# Patient Record
Sex: Female | Born: 1960 | Race: Black or African American | Hispanic: No | Smoking: Never smoker
Health system: Southern US, Community
[De-identification: ages and names within clinical notes are randomized; demographics above are authoritative.]

---

## 1995-04-05 ENCOUNTER — Encounter (INDEPENDENT_AMBULATORY_CARE_PROVIDER_SITE_OTHER): Payer: Self-pay | Admitting: *Deleted

## 1995-04-05 ENCOUNTER — Encounter: Payer: Self-pay | Admitting: Internal Medicine

## 1997-12-23 ENCOUNTER — Encounter: Admission: RE | Admit: 1997-12-23 | Discharge: 1997-12-23 | Payer: Self-pay | Admitting: Internal Medicine

## 1998-01-13 ENCOUNTER — Encounter: Admission: RE | Admit: 1998-01-13 | Discharge: 1998-01-13 | Payer: Self-pay | Admitting: Internal Medicine

## 1998-03-31 ENCOUNTER — Encounter: Admission: RE | Admit: 1998-03-31 | Discharge: 1998-03-31 | Payer: Self-pay | Admitting: Internal Medicine

## 1998-08-04 ENCOUNTER — Encounter: Admission: RE | Admit: 1998-08-04 | Discharge: 1998-08-04 | Payer: Self-pay | Admitting: Internal Medicine

## 1998-09-28 ENCOUNTER — Encounter: Admission: RE | Admit: 1998-09-28 | Discharge: 1998-09-28 | Payer: Self-pay | Admitting: Internal Medicine

## 1999-01-04 ENCOUNTER — Encounter: Admission: RE | Admit: 1999-01-04 | Discharge: 1999-01-04 | Payer: Self-pay | Admitting: Internal Medicine

## 1999-01-04 ENCOUNTER — Ambulatory Visit (HOSPITAL_COMMUNITY): Admission: RE | Admit: 1999-01-04 | Discharge: 1999-01-04 | Payer: Self-pay | Admitting: Internal Medicine

## 1999-04-05 ENCOUNTER — Ambulatory Visit (HOSPITAL_COMMUNITY): Admission: RE | Admit: 1999-04-05 | Discharge: 1999-04-05 | Payer: Self-pay | Admitting: Internal Medicine

## 1999-04-05 ENCOUNTER — Encounter: Admission: RE | Admit: 1999-04-05 | Discharge: 1999-04-05 | Payer: Self-pay | Admitting: Internal Medicine

## 1999-07-06 ENCOUNTER — Ambulatory Visit (HOSPITAL_COMMUNITY): Admission: RE | Admit: 1999-07-06 | Discharge: 1999-07-06 | Payer: Self-pay | Admitting: Internal Medicine

## 1999-07-06 ENCOUNTER — Encounter: Admission: RE | Admit: 1999-07-06 | Discharge: 1999-07-06 | Payer: Self-pay | Admitting: Internal Medicine

## 1999-09-20 ENCOUNTER — Encounter: Admission: RE | Admit: 1999-09-20 | Discharge: 1999-09-20 | Payer: Self-pay | Admitting: Internal Medicine

## 2000-01-17 ENCOUNTER — Encounter: Admission: RE | Admit: 2000-01-17 | Discharge: 2000-01-17 | Payer: Self-pay | Admitting: Internal Medicine

## 2000-01-17 ENCOUNTER — Ambulatory Visit (HOSPITAL_COMMUNITY): Admission: RE | Admit: 2000-01-17 | Discharge: 2000-01-17 | Payer: Self-pay | Admitting: Internal Medicine

## 2000-03-20 ENCOUNTER — Ambulatory Visit (HOSPITAL_COMMUNITY): Admission: RE | Admit: 2000-03-20 | Discharge: 2000-03-20 | Payer: Self-pay | Admitting: Internal Medicine

## 2000-03-20 ENCOUNTER — Encounter: Admission: RE | Admit: 2000-03-20 | Discharge: 2000-03-20 | Payer: Self-pay | Admitting: Internal Medicine

## 2000-03-20 ENCOUNTER — Other Ambulatory Visit: Admission: RE | Admit: 2000-03-20 | Discharge: 2000-03-20 | Payer: Self-pay | Admitting: Internal Medicine

## 2000-05-16 ENCOUNTER — Encounter: Admission: RE | Admit: 2000-05-16 | Discharge: 2000-05-16 | Payer: Self-pay | Admitting: Obstetrics & Gynecology

## 2000-05-16 ENCOUNTER — Other Ambulatory Visit: Admission: RE | Admit: 2000-05-16 | Discharge: 2000-05-16 | Payer: Self-pay | Admitting: Obstetrics

## 2000-07-03 ENCOUNTER — Ambulatory Visit (HOSPITAL_COMMUNITY): Admission: RE | Admit: 2000-07-03 | Discharge: 2000-07-03 | Payer: Self-pay | Admitting: Internal Medicine

## 2000-07-03 ENCOUNTER — Encounter: Admission: RE | Admit: 2000-07-03 | Discharge: 2000-07-03 | Payer: Self-pay | Admitting: Internal Medicine

## 2000-07-18 ENCOUNTER — Encounter: Admission: RE | Admit: 2000-07-18 | Discharge: 2000-07-18 | Payer: Self-pay | Admitting: Internal Medicine

## 2000-12-26 ENCOUNTER — Encounter: Admission: RE | Admit: 2000-12-26 | Discharge: 2000-12-26 | Payer: Self-pay | Admitting: Internal Medicine

## 2001-07-03 ENCOUNTER — Encounter: Admission: RE | Admit: 2001-07-03 | Discharge: 2001-07-03 | Payer: Self-pay | Admitting: Internal Medicine

## 2001-07-03 ENCOUNTER — Ambulatory Visit (HOSPITAL_COMMUNITY): Admission: RE | Admit: 2001-07-03 | Discharge: 2001-07-03 | Payer: Self-pay | Admitting: Internal Medicine

## 2001-08-28 ENCOUNTER — Encounter: Admission: RE | Admit: 2001-08-28 | Discharge: 2001-08-28 | Payer: Self-pay | Admitting: Internal Medicine

## 2001-10-30 ENCOUNTER — Ambulatory Visit (HOSPITAL_COMMUNITY): Admission: RE | Admit: 2001-10-30 | Discharge: 2001-10-30 | Payer: Self-pay | Admitting: Internal Medicine

## 2001-10-30 ENCOUNTER — Encounter: Admission: RE | Admit: 2001-10-30 | Discharge: 2001-10-30 | Payer: Self-pay | Admitting: Internal Medicine

## 2002-03-26 ENCOUNTER — Encounter: Admission: RE | Admit: 2002-03-26 | Discharge: 2002-03-26 | Payer: Self-pay | Admitting: Internal Medicine

## 2003-02-19 ENCOUNTER — Encounter: Admission: RE | Admit: 2003-02-19 | Discharge: 2003-02-19 | Payer: Self-pay | Admitting: Internal Medicine

## 2003-02-19 ENCOUNTER — Ambulatory Visit (HOSPITAL_COMMUNITY): Admission: RE | Admit: 2003-02-19 | Discharge: 2003-02-19 | Payer: Self-pay | Admitting: Internal Medicine

## 2003-02-19 ENCOUNTER — Encounter: Payer: Self-pay | Admitting: Internal Medicine

## 2003-07-08 ENCOUNTER — Encounter: Admission: RE | Admit: 2003-07-08 | Discharge: 2003-07-08 | Payer: Self-pay | Admitting: Internal Medicine

## 2003-07-22 ENCOUNTER — Ambulatory Visit (HOSPITAL_COMMUNITY): Admission: RE | Admit: 2003-07-22 | Discharge: 2003-07-22 | Payer: Self-pay | Admitting: Internal Medicine

## 2003-07-22 ENCOUNTER — Encounter (INDEPENDENT_AMBULATORY_CARE_PROVIDER_SITE_OTHER): Payer: Self-pay | Admitting: Infectious Diseases

## 2003-07-22 ENCOUNTER — Encounter: Admission: RE | Admit: 2003-07-22 | Discharge: 2003-07-22 | Payer: Self-pay | Admitting: Infectious Diseases

## 2003-09-08 ENCOUNTER — Encounter: Admission: RE | Admit: 2003-09-08 | Discharge: 2003-09-08 | Payer: Self-pay | Admitting: Internal Medicine

## 2004-02-04 ENCOUNTER — Encounter: Admission: RE | Admit: 2004-02-04 | Discharge: 2004-02-04 | Payer: Self-pay | Admitting: Infectious Diseases

## 2004-06-03 ENCOUNTER — Ambulatory Visit: Payer: Self-pay | Admitting: Internal Medicine

## 2004-06-03 ENCOUNTER — Ambulatory Visit (HOSPITAL_COMMUNITY): Admission: RE | Admit: 2004-06-03 | Discharge: 2004-06-03 | Payer: Self-pay | Admitting: Internal Medicine

## 2004-09-06 ENCOUNTER — Ambulatory Visit (HOSPITAL_COMMUNITY): Admission: RE | Admit: 2004-09-06 | Discharge: 2004-09-06 | Payer: Self-pay | Admitting: Internal Medicine

## 2004-09-06 ENCOUNTER — Encounter (INDEPENDENT_AMBULATORY_CARE_PROVIDER_SITE_OTHER): Payer: Self-pay | Admitting: *Deleted

## 2004-09-06 ENCOUNTER — Ambulatory Visit: Payer: Self-pay | Admitting: Internal Medicine

## 2004-09-06 LAB — CONVERTED CEMR LAB: CD4 Count: 430 microliters

## 2004-10-19 ENCOUNTER — Ambulatory Visit: Payer: Self-pay | Admitting: Internal Medicine

## 2004-10-19 ENCOUNTER — Encounter (INDEPENDENT_AMBULATORY_CARE_PROVIDER_SITE_OTHER): Payer: Self-pay | Admitting: *Deleted

## 2004-11-25 ENCOUNTER — Other Ambulatory Visit: Admission: RE | Admit: 2004-11-25 | Discharge: 2004-11-25 | Payer: Self-pay | Admitting: Family Medicine

## 2004-11-25 ENCOUNTER — Encounter: Payer: Self-pay | Admitting: Internal Medicine

## 2004-11-25 ENCOUNTER — Ambulatory Visit: Payer: Self-pay | Admitting: Family Medicine

## 2004-11-25 ENCOUNTER — Encounter (INDEPENDENT_AMBULATORY_CARE_PROVIDER_SITE_OTHER): Payer: Self-pay | Admitting: Specialist

## 2004-12-09 ENCOUNTER — Ambulatory Visit: Payer: Self-pay | Admitting: Family Medicine

## 2005-03-22 ENCOUNTER — Ambulatory Visit: Payer: Self-pay | Admitting: Internal Medicine

## 2005-03-22 ENCOUNTER — Ambulatory Visit (HOSPITAL_COMMUNITY): Admission: RE | Admit: 2005-03-22 | Discharge: 2005-03-22 | Payer: Self-pay | Admitting: Internal Medicine

## 2005-03-22 ENCOUNTER — Encounter (INDEPENDENT_AMBULATORY_CARE_PROVIDER_SITE_OTHER): Payer: Self-pay | Admitting: *Deleted

## 2005-06-14 ENCOUNTER — Ambulatory Visit: Payer: Self-pay | Admitting: Internal Medicine

## 2006-02-28 ENCOUNTER — Ambulatory Visit: Payer: Self-pay | Admitting: Internal Medicine

## 2006-02-28 ENCOUNTER — Encounter (INDEPENDENT_AMBULATORY_CARE_PROVIDER_SITE_OTHER): Payer: Self-pay | Admitting: *Deleted

## 2006-02-28 ENCOUNTER — Encounter: Admission: RE | Admit: 2006-02-28 | Discharge: 2006-02-28 | Payer: Self-pay | Admitting: Internal Medicine

## 2006-03-14 ENCOUNTER — Ambulatory Visit: Payer: Self-pay | Admitting: Internal Medicine

## 2006-10-09 ENCOUNTER — Encounter (INDEPENDENT_AMBULATORY_CARE_PROVIDER_SITE_OTHER): Payer: Self-pay | Admitting: *Deleted

## 2006-10-09 LAB — CONVERTED CEMR LAB: Pap Smear: ABNORMAL

## 2006-10-13 DIAGNOSIS — G5600 Carpal tunnel syndrome, unspecified upper limb: Secondary | ICD-10-CM | POA: Insufficient documentation

## 2006-10-13 DIAGNOSIS — K219 Gastro-esophageal reflux disease without esophagitis: Secondary | ICD-10-CM | POA: Insufficient documentation

## 2006-10-13 DIAGNOSIS — F79 Unspecified intellectual disabilities: Secondary | ICD-10-CM

## 2006-10-13 DIAGNOSIS — F1021 Alcohol dependence, in remission: Secondary | ICD-10-CM

## 2006-10-13 DIAGNOSIS — B20 Human immunodeficiency virus [HIV] disease: Secondary | ICD-10-CM

## 2006-10-13 DIAGNOSIS — K029 Dental caries, unspecified: Secondary | ICD-10-CM | POA: Insufficient documentation

## 2006-10-13 DIAGNOSIS — L0292 Furuncle, unspecified: Secondary | ICD-10-CM | POA: Insufficient documentation

## 2006-10-13 DIAGNOSIS — Z87891 Personal history of nicotine dependence: Secondary | ICD-10-CM

## 2006-10-13 DIAGNOSIS — L0293 Carbuncle, unspecified: Secondary | ICD-10-CM

## 2006-10-13 DIAGNOSIS — E669 Obesity, unspecified: Secondary | ICD-10-CM

## 2006-10-19 ENCOUNTER — Encounter: Admission: RE | Admit: 2006-10-19 | Discharge: 2006-10-19 | Payer: Self-pay | Admitting: Internal Medicine

## 2006-10-19 ENCOUNTER — Ambulatory Visit: Payer: Self-pay | Admitting: Internal Medicine

## 2006-10-19 LAB — CONVERTED CEMR LAB
ALT: 13 units/L (ref 0–35)
AST: 18 units/L (ref 0–37)
Alkaline Phosphatase: 83 units/L (ref 39–117)
Creatinine, Ser: 0.67 mg/dL (ref 0.40–1.20)
HCT: 37.6 % (ref 36.0–46.0)
HIV 1 RNA Quant: 42900 copies/mL — ABNORMAL HIGH (ref <50)
LDL Cholesterol: 41 mg/dL (ref 0–99)
MCV: 77.4 fL — ABNORMAL LOW (ref 78.0–100.0)
Platelets: 295 10*3/uL (ref 150–400)
RDW: 16.5 % — ABNORMAL HIGH (ref 11.5–14.0)
Sodium: 138 meq/L (ref 135–145)
Total Bilirubin: 0.3 mg/dL (ref 0.3–1.2)
Total CHOL/HDL Ratio: 3.5
Total Protein: 9 g/dL — ABNORMAL HIGH (ref 6.0–8.3)
VLDL: 55 mg/dL — ABNORMAL HIGH (ref 0–40)

## 2006-10-22 ENCOUNTER — Encounter (INDEPENDENT_AMBULATORY_CARE_PROVIDER_SITE_OTHER): Payer: Self-pay | Admitting: *Deleted

## 2006-11-10 ENCOUNTER — Other Ambulatory Visit: Admission: RE | Admit: 2006-11-10 | Discharge: 2006-11-10 | Payer: Self-pay | Admitting: Internal Medicine

## 2006-11-10 ENCOUNTER — Encounter (INDEPENDENT_AMBULATORY_CARE_PROVIDER_SITE_OTHER): Payer: Self-pay | Admitting: *Deleted

## 2006-11-10 ENCOUNTER — Ambulatory Visit: Payer: Self-pay | Admitting: Internal Medicine

## 2006-11-10 LAB — CONVERTED CEMR LAB

## 2006-11-13 ENCOUNTER — Encounter: Payer: Self-pay | Admitting: Internal Medicine

## 2006-11-13 LAB — CONVERTED CEMR LAB: Pap Smear: ABNORMAL

## 2006-11-21 ENCOUNTER — Encounter: Payer: Self-pay | Admitting: Internal Medicine

## 2007-04-18 ENCOUNTER — Ambulatory Visit: Payer: Self-pay | Admitting: Internal Medicine

## 2007-04-18 ENCOUNTER — Encounter: Admission: RE | Admit: 2007-04-18 | Discharge: 2007-04-18 | Payer: Self-pay | Admitting: Internal Medicine

## 2007-04-18 LAB — CONVERTED CEMR LAB
AST: 17 units/L (ref 0–37)
BUN: 11 mg/dL (ref 6–23)
Calcium: 8.4 mg/dL (ref 8.4–10.5)
Chloride: 105 meq/L (ref 96–112)
Creatinine, Ser: 0.67 mg/dL (ref 0.40–1.20)
HCT: 34 % — ABNORMAL LOW (ref 36.0–46.0)
HIV 1 RNA Quant: 28200 copies/mL — ABNORMAL HIGH (ref <50)
Hemoglobin: 10.4 g/dL — ABNORMAL LOW (ref 12.0–15.0)
MCHC: 30.6 g/dL (ref 30.0–36.0)
MCV: 73 fL — ABNORMAL LOW (ref 78.0–100.0)
RDW: 17 % — ABNORMAL HIGH (ref 11.5–14.0)
Total Bilirubin: 0.4 mg/dL (ref 0.3–1.2)

## 2007-07-18 ENCOUNTER — Encounter: Payer: Self-pay | Admitting: Internal Medicine

## 2007-07-21 ENCOUNTER — Encounter: Payer: Self-pay | Admitting: Internal Medicine

## 2007-08-03 ENCOUNTER — Encounter: Payer: Self-pay | Admitting: Internal Medicine

## 2007-08-06 ENCOUNTER — Encounter: Payer: Self-pay | Admitting: Internal Medicine

## 2007-08-14 ENCOUNTER — Encounter: Payer: Self-pay | Admitting: Internal Medicine

## 2007-08-21 ENCOUNTER — Encounter: Admission: RE | Admit: 2007-08-21 | Discharge: 2007-08-21 | Payer: Self-pay | Admitting: Internal Medicine

## 2007-08-21 ENCOUNTER — Ambulatory Visit: Payer: Self-pay | Admitting: Internal Medicine

## 2007-08-21 LAB — CONVERTED CEMR LAB
Albumin: 3.6 g/dL (ref 3.5–5.2)
BUN: 20 mg/dL (ref 6–23)
Calcium: 8.5 mg/dL (ref 8.4–10.5)
Chloride: 108 meq/L (ref 96–112)
Glucose, Bld: 95 mg/dL (ref 70–99)
HDL: 44 mg/dL (ref >39)
Lymphs Abs: 2.9 10*3/uL (ref 0.7–4.0)
MCV: 74 fL — ABNORMAL LOW (ref 78.0–100.0)
Monocytes Relative: 8 % (ref 3–12)
Neutro Abs: 2.5 10*3/uL (ref 1.7–7.7)
Neutrophils Relative %: 42 % — ABNORMAL LOW (ref 43–77)
Potassium: 4.1 meq/L (ref 3.5–5.3)
RBC: 4.5 M/uL (ref 3.87–5.11)
Triglycerides: 146 mg/dL (ref <150)
WBC: 6 10*3/uL (ref 4.0–10.5)

## 2007-08-27 ENCOUNTER — Telehealth: Payer: Self-pay | Admitting: Internal Medicine

## 2007-08-30 ENCOUNTER — Encounter: Payer: Self-pay | Admitting: Internal Medicine

## 2007-10-11 ENCOUNTER — Encounter (INDEPENDENT_AMBULATORY_CARE_PROVIDER_SITE_OTHER): Payer: Self-pay | Admitting: *Deleted

## 2007-10-18 ENCOUNTER — Ambulatory Visit: Payer: Self-pay | Admitting: Internal Medicine

## 2007-10-18 DIAGNOSIS — I10 Essential (primary) hypertension: Secondary | ICD-10-CM | POA: Insufficient documentation

## 2007-10-31 ENCOUNTER — Telehealth: Payer: Self-pay | Admitting: Internal Medicine

## 2007-11-13 ENCOUNTER — Ambulatory Visit: Payer: Self-pay | Admitting: Internal Medicine

## 2007-11-21 ENCOUNTER — Ambulatory Visit: Payer: Self-pay | Admitting: Obstetrics and Gynecology

## 2007-11-21 ENCOUNTER — Encounter: Payer: Self-pay | Admitting: Family

## 2007-11-21 ENCOUNTER — Encounter: Payer: Self-pay | Admitting: Internal Medicine

## 2007-11-28 ENCOUNTER — Ambulatory Visit (HOSPITAL_COMMUNITY): Admission: RE | Admit: 2007-11-28 | Discharge: 2007-11-28 | Payer: Self-pay | Admitting: Obstetrics and Gynecology

## 2007-12-11 ENCOUNTER — Encounter: Payer: Self-pay | Admitting: Internal Medicine

## 2007-12-27 ENCOUNTER — Encounter: Admission: RE | Admit: 2007-12-27 | Discharge: 2007-12-27 | Payer: Self-pay | Admitting: Internal Medicine

## 2007-12-27 ENCOUNTER — Ambulatory Visit: Payer: Self-pay | Admitting: Internal Medicine

## 2007-12-27 LAB — CONVERTED CEMR LAB
CO2: 21 meq/L (ref 19–32)
Calcium: 8.9 mg/dL (ref 8.4–10.5)
Cholesterol: 139 mg/dL (ref 0–200)
Creatinine, Ser: 0.7 mg/dL (ref 0.40–1.20)
Glucose, Bld: 69 mg/dL — ABNORMAL LOW (ref 70–99)
HCT: 34.9 % — ABNORMAL LOW (ref 36.0–46.0)
HIV-1 RNA Quant, Log: 3.88 — ABNORMAL HIGH (ref <1.70)
MCV: 74.4 fL — ABNORMAL LOW (ref 78.0–100.0)
RBC: 4.69 M/uL (ref 3.87–5.11)
Sodium: 135 meq/L (ref 135–145)
Total Bilirubin: 0.2 mg/dL — ABNORMAL LOW (ref 0.3–1.2)
Total Protein: 8.2 g/dL (ref 6.0–8.3)
Triglycerides: 85 mg/dL (ref <150)
VLDL: 17 mg/dL (ref 0–40)
WBC: 6.7 10*3/uL (ref 4.0–10.5)

## 2007-12-28 ENCOUNTER — Encounter: Payer: Self-pay | Admitting: Internal Medicine

## 2008-01-15 ENCOUNTER — Ambulatory Visit: Payer: Self-pay | Admitting: Internal Medicine

## 2008-01-15 LAB — CONVERTED CEMR LAB

## 2008-02-28 ENCOUNTER — Ambulatory Visit: Payer: Self-pay | Admitting: Internal Medicine

## 2008-03-04 ENCOUNTER — Encounter: Payer: Self-pay | Admitting: Internal Medicine

## 2008-04-16 ENCOUNTER — Telehealth (INDEPENDENT_AMBULATORY_CARE_PROVIDER_SITE_OTHER): Payer: Self-pay | Admitting: *Deleted

## 2008-04-29 ENCOUNTER — Ambulatory Visit: Payer: Self-pay | Admitting: Internal Medicine

## 2008-05-28 ENCOUNTER — Encounter: Payer: Self-pay | Admitting: Internal Medicine

## 2008-06-18 ENCOUNTER — Ambulatory Visit: Payer: Self-pay | Admitting: Internal Medicine

## 2008-06-18 LAB — CONVERTED CEMR LAB
HIV 1 RNA Quant: 48900 copies/mL — ABNORMAL HIGH (ref <48)
HIV-1 RNA Quant, Log: 4.69 — ABNORMAL HIGH (ref <1.68)

## 2008-07-08 ENCOUNTER — Ambulatory Visit: Payer: Self-pay | Admitting: Internal Medicine

## 2008-08-25 ENCOUNTER — Telehealth: Payer: Self-pay | Admitting: Internal Medicine

## 2008-09-18 ENCOUNTER — Telehealth: Payer: Self-pay | Admitting: Internal Medicine

## 2008-10-06 ENCOUNTER — Telehealth: Payer: Self-pay | Admitting: Internal Medicine

## 2008-10-29 ENCOUNTER — Telehealth (INDEPENDENT_AMBULATORY_CARE_PROVIDER_SITE_OTHER): Payer: Self-pay | Admitting: *Deleted

## 2008-11-24 ENCOUNTER — Ambulatory Visit: Payer: Self-pay | Admitting: Internal Medicine

## 2008-11-24 LAB — CONVERTED CEMR LAB
ALT: 9 units/L (ref 0–35)
AST: 29 units/L (ref 0–37)
Alkaline Phosphatase: 74 units/L (ref 39–117)
Basophils Absolute: 0 10*3/uL (ref 0.0–0.1)
Basophils Relative: 1 % (ref 0–1)
CO2: 19 meq/L (ref 19–32)
Eosinophils Relative: 1 % (ref 0–5)
HCT: 34.6 % — ABNORMAL LOW (ref 36.0–46.0)
HIV 1 RNA Quant: 24600 copies/mL — ABNORMAL HIGH (ref <48)
HIV-1 RNA Quant, Log: 4.39 — ABNORMAL HIGH (ref <1.68)
Hemoglobin: 11 g/dL — ABNORMAL LOW (ref 12.0–15.0)
LDL Cholesterol: 69 mg/dL (ref 0–99)
MCHC: 31.8 g/dL (ref 30.0–36.0)
MCV: 73 fL — ABNORMAL LOW (ref 78.0–100.0)
Monocytes Absolute: 0.6 10*3/uL (ref 0.1–1.0)
Monocytes Relative: 10 % (ref 3–12)
RDW: 16.9 % — ABNORMAL HIGH (ref 11.5–15.5)
Sodium: 138 meq/L (ref 135–145)
Total Bilirubin: 0.4 mg/dL (ref 0.3–1.2)
Total CHOL/HDL Ratio: 3.1
Total Protein: 8.4 g/dL — ABNORMAL HIGH (ref 6.0–8.3)
VLDL: 25 mg/dL (ref 0–40)

## 2008-11-26 ENCOUNTER — Encounter: Payer: Self-pay | Admitting: Internal Medicine

## 2008-12-08 ENCOUNTER — Encounter: Payer: Self-pay | Admitting: Internal Medicine

## 2008-12-12 ENCOUNTER — Encounter: Payer: Self-pay | Admitting: Internal Medicine

## 2008-12-12 ENCOUNTER — Ambulatory Visit: Payer: Self-pay | Admitting: Internal Medicine

## 2008-12-12 DIAGNOSIS — R87610 Atypical squamous cells of undetermined significance on cytologic smear of cervix (ASC-US): Secondary | ICD-10-CM

## 2008-12-24 ENCOUNTER — Encounter: Payer: Self-pay | Admitting: Internal Medicine

## 2009-03-31 ENCOUNTER — Encounter: Payer: Self-pay | Admitting: Internal Medicine

## 2009-04-21 ENCOUNTER — Ambulatory Visit: Payer: Self-pay | Admitting: Internal Medicine

## 2009-05-08 ENCOUNTER — Encounter: Payer: Self-pay | Admitting: Obstetrics & Gynecology

## 2009-05-08 ENCOUNTER — Encounter: Payer: Self-pay | Admitting: Internal Medicine

## 2009-05-08 ENCOUNTER — Ambulatory Visit: Payer: Self-pay | Admitting: Obstetrics and Gynecology

## 2009-05-08 ENCOUNTER — Other Ambulatory Visit: Admission: RE | Admit: 2009-05-08 | Discharge: 2009-05-08 | Payer: Self-pay | Admitting: Obstetrics and Gynecology

## 2009-08-31 ENCOUNTER — Encounter: Payer: Self-pay | Admitting: Internal Medicine

## 2009-09-03 ENCOUNTER — Ambulatory Visit (HOSPITAL_COMMUNITY): Admission: RE | Admit: 2009-09-03 | Discharge: 2009-09-03 | Payer: Self-pay | Admitting: Internal Medicine

## 2009-09-03 ENCOUNTER — Ambulatory Visit: Payer: Self-pay | Admitting: Internal Medicine

## 2009-09-03 DIAGNOSIS — R079 Chest pain, unspecified: Secondary | ICD-10-CM

## 2009-09-03 DIAGNOSIS — R0989 Other specified symptoms and signs involving the circulatory and respiratory systems: Secondary | ICD-10-CM

## 2009-09-03 DIAGNOSIS — R0609 Other forms of dyspnea: Secondary | ICD-10-CM

## 2009-09-22 ENCOUNTER — Ambulatory Visit (HOSPITAL_COMMUNITY): Admission: RE | Admit: 2009-09-22 | Discharge: 2009-09-22 | Payer: Self-pay | Admitting: Internal Medicine

## 2009-09-22 ENCOUNTER — Encounter: Payer: Self-pay | Admitting: Internal Medicine

## 2009-09-29 ENCOUNTER — Ambulatory Visit: Payer: Self-pay | Admitting: Internal Medicine

## 2009-10-13 ENCOUNTER — Telehealth (INDEPENDENT_AMBULATORY_CARE_PROVIDER_SITE_OTHER): Payer: Self-pay | Admitting: *Deleted

## 2009-11-12 ENCOUNTER — Ambulatory Visit: Payer: Self-pay | Admitting: Internal Medicine

## 2009-11-12 LAB — CONVERTED CEMR LAB
AST: 16 units/L (ref 0–37)
Albumin: 4.1 g/dL (ref 3.5–5.2)
BUN: 14 mg/dL (ref 6–23)
Basophils Absolute: 0 10*3/uL (ref 0.0–0.1)
CO2: 23 meq/L (ref 19–32)
Calcium: 9.7 mg/dL (ref 8.4–10.5)
Chloride: 103 meq/L (ref 96–112)
Creatinine, Ser: 0.72 mg/dL (ref 0.40–1.20)
Glucose, Bld: 83 mg/dL (ref 70–99)
HCT: 36.5 % (ref 36.0–46.0)
HIV 1 RNA Quant: 2460 copies/mL — ABNORMAL HIGH (ref <48)
HIV-1 RNA Quant, Log: 3.39 — ABNORMAL HIGH (ref <1.68)
Hemoglobin: 11.3 g/dL — ABNORMAL LOW (ref 12.0–15.0)
Lymphocytes Relative: 54 % — ABNORMAL HIGH (ref 12–46)
Lymphs Abs: 3.4 10*3/uL (ref 0.7–4.0)
Monocytes Absolute: 0.5 10*3/uL (ref 0.1–1.0)
Neutro Abs: 2.1 10*3/uL (ref 1.7–7.7)
Potassium: 4.8 meq/L (ref 3.5–5.3)
RDW: 16.9 % — ABNORMAL HIGH (ref 11.5–15.5)
WBC: 6.3 10*3/uL (ref 4.0–10.5)

## 2009-11-17 ENCOUNTER — Ambulatory Visit: Payer: Self-pay | Admitting: Internal Medicine

## 2009-11-17 LAB — CONVERTED CEMR LAB

## 2009-12-01 ENCOUNTER — Ambulatory Visit: Payer: Self-pay | Admitting: Internal Medicine

## 2009-12-25 ENCOUNTER — Encounter (INDEPENDENT_AMBULATORY_CARE_PROVIDER_SITE_OTHER): Payer: Self-pay | Admitting: *Deleted

## 2010-01-06 ENCOUNTER — Encounter (INDEPENDENT_AMBULATORY_CARE_PROVIDER_SITE_OTHER): Payer: Self-pay | Admitting: *Deleted

## 2010-01-19 ENCOUNTER — Ambulatory Visit: Payer: Self-pay | Admitting: Internal Medicine

## 2010-01-19 LAB — CONVERTED CEMR LAB: HIV-1 RNA Quant, Log: <1.68 (ref <1.68)

## 2010-01-21 ENCOUNTER — Encounter (INDEPENDENT_AMBULATORY_CARE_PROVIDER_SITE_OTHER): Payer: Self-pay | Admitting: *Deleted

## 2010-02-02 ENCOUNTER — Ambulatory Visit: Payer: Self-pay | Admitting: Internal Medicine

## 2010-02-02 DIAGNOSIS — M79609 Pain in unspecified limb: Secondary | ICD-10-CM

## 2010-02-03 ENCOUNTER — Encounter (INDEPENDENT_AMBULATORY_CARE_PROVIDER_SITE_OTHER): Payer: Self-pay | Admitting: *Deleted

## 2010-03-23 ENCOUNTER — Ambulatory Visit: Payer: Self-pay | Admitting: Internal Medicine

## 2010-04-02 ENCOUNTER — Telehealth (INDEPENDENT_AMBULATORY_CARE_PROVIDER_SITE_OTHER): Payer: Self-pay | Admitting: *Deleted

## 2010-04-12 ENCOUNTER — Encounter: Payer: Self-pay | Admitting: Internal Medicine

## 2010-04-12 ENCOUNTER — Ambulatory Visit: Payer: Self-pay | Admitting: Infectious Disease

## 2010-04-12 DIAGNOSIS — N644 Mastodynia: Secondary | ICD-10-CM | POA: Insufficient documentation

## 2010-04-12 LAB — CONVERTED CEMR LAB
ALT: <8 units/L (ref 0–35)
AST: 15 units/L (ref 0–37)
Alkaline Phosphatase: 97 units/L (ref 39–117)
Basophils Absolute: 0 10*3/uL (ref 0.0–0.1)
Eosinophils Absolute: 0.1 10*3/uL (ref 0.0–0.7)
Eosinophils Relative: 2 % (ref 0–5)
HCT: 34.1 % — ABNORMAL LOW (ref 36.0–46.0)
MCV: 85.3 fL (ref 78.0–100.0)
Neutrophils Relative %: 37 % — ABNORMAL LOW (ref 43–77)
Platelets: 374 10*3/uL (ref 150–400)
RDW: 15.6 % — ABNORMAL HIGH (ref 11.5–15.5)
Sodium: 140 meq/L (ref 135–145)
Total Bilirubin: 0.5 mg/dL (ref 0.3–1.2)
Total Protein: 7.3 g/dL (ref 6.0–8.3)

## 2010-04-29 ENCOUNTER — Ambulatory Visit (HOSPITAL_COMMUNITY): Admission: RE | Admit: 2010-04-29 | Discharge: 2010-04-29 | Payer: Self-pay | Admitting: Infectious Disease

## 2010-06-21 ENCOUNTER — Ambulatory Visit: Payer: Self-pay | Admitting: Internal Medicine

## 2010-06-24 ENCOUNTER — Encounter: Payer: Self-pay | Admitting: Internal Medicine

## 2010-06-24 LAB — CONVERTED CEMR LAB

## 2010-07-15 ENCOUNTER — Encounter (INDEPENDENT_AMBULATORY_CARE_PROVIDER_SITE_OTHER): Payer: Self-pay | Admitting: *Deleted

## 2010-08-03 ENCOUNTER — Ambulatory Visit: Payer: Self-pay | Admitting: Internal Medicine

## 2010-08-24 ENCOUNTER — Encounter: Payer: Self-pay | Admitting: Internal Medicine

## 2010-08-24 DIAGNOSIS — J069 Acute upper respiratory infection, unspecified: Secondary | ICD-10-CM | POA: Insufficient documentation

## 2010-08-24 LAB — CONVERTED CEMR LAB
ALT: 11 units/L (ref 0–35)
AST: 15 units/L (ref 0–37)
Basophils Absolute: 0 10*3/uL (ref 0.0–0.1)
Calcium: 9.2 mg/dL (ref 8.4–10.5)
Chloride: 106 meq/L (ref 96–112)
Cholesterol: 142 mg/dL (ref 0–200)
Creatinine, Ser: 0.66 mg/dL (ref 0.40–1.20)
Eosinophils Absolute: 0.1 10*3/uL (ref 0.0–0.7)
Eosinophils Relative: 2 % (ref 0–5)
HCT: 35.9 % — ABNORMAL LOW (ref 36.0–46.0)
HIV 1 RNA Quant: 16800 copies/mL — ABNORMAL HIGH (ref <20)
Lymphocytes Relative: 40 % (ref 12–46)
Platelets: 291 10*3/uL (ref 150–400)
Potassium: 4.8 meq/L (ref 3.5–5.3)
RDW: 17 % — ABNORMAL HIGH (ref 11.5–15.5)
Sodium: 137 meq/L (ref 135–145)
Total CHOL/HDL Ratio: 2.3

## 2010-09-06 ENCOUNTER — Encounter: Payer: Self-pay | Admitting: Internal Medicine

## 2010-09-12 LAB — CONVERTED CEMR LAB
ALT: 10 units/L (ref 0–35)
BUN: 8 mg/dL (ref 6–23)
Basophils Relative: 0 % (ref 0–1)
CO2: 22 meq/L (ref 19–32)
Calcium: 9 mg/dL (ref 8.4–10.5)
Chloride: 106 meq/L (ref 96–112)
Cholesterol: 132 mg/dL (ref 0–200)
Creatinine, Ser: 0.68 mg/dL (ref 0.40–1.20)
Eosinophils Absolute: 0 10*3/uL (ref 0.0–0.7)
Eosinophils Relative: 1 % (ref 0–5)
HCT: 34.8 % — ABNORMAL LOW (ref 36.0–46.0)
HDL: 47 mg/dL (ref >39)
HIV 1 RNA Quant: 54100 copies/mL — ABNORMAL HIGH (ref <48)
Lymphs Abs: 2.4 10*3/uL (ref 0.7–4.0)
MCHC: 31.9 g/dL (ref 30.0–36.0)
MCV: 76.5 fL — ABNORMAL LOW (ref >=78.0)
Monocytes Relative: 10 % (ref 3–12)
Neutrophils Relative %: 50 % (ref 43–77)
Platelets: 321 10*3/uL (ref 150–400)
RBC: 4.55 M/uL (ref 3.87–5.11)
Total Bilirubin: 0.3 mg/dL (ref 0.3–1.2)
Total CHOL/HDL Ratio: 2.8
Triglycerides: 86 mg/dL (ref <150)
VLDL: 17 mg/dL (ref 0–40)

## 2010-09-16 NOTE — Miscellaneous (Signed)
Summary: [facility]  Clinical Lists Changes BC provided transportation to [facility] yesterday. Pt's counts are great and BC praised her for her efforts. BC discussed what a CD4 and VL are with pt again today. BC provided pt with a snack and a drink to take her medications that she had missed prior to her appointment. BC encouraged pt to continue her medications as directed because her numbers were a direct reflection of her adherence to her medications. BC took pt to [facility] to get food. BC discussed with pt that [facility] will have to close her file due to her doing so well. Pt understands and will use medicaid transportation moving forward.  Sharol Roussel  February 03, 2010 9:39 AM

## 2010-09-16 NOTE — Assessment & Plan Note (Signed)
Summary: F/U/VS   CC:  follow-up visit, went to [facility] ED 11/26/09, dxed w./ bronchitis, given meds., and was not given anything for cough which is keeping her awake at night.  History of Present Illness: Jenna Watts is in for her routine visit.  It is immediately clear that she does not understand how to take her medications.  She has her Combivir, Viread, and Norvir with her but does not have Reyataz.  She says that I did not give her a prescription for a fourth medication.  Her Viread bottle is empty, there is only one Norvir tablet left, and her Combivir bottle refilled on February 15 is still have full.  Preventive Screening-Counseling & Management  Alcohol-Tobacco     Alcohol drinks/day: 0     Smoking Status: never     Passive Smoke Exposure: no  Caffeine-Diet-Exercise     Caffeine use/day: yes, one glass of tea per day     Does Patient Exercise: yes     Type of exercise: walking     Exercise (avg: min/session): <30     Times/week: 6  Hep-HIV-STD-Contraception     HIV Risk: no risk noted  Safety-Violence-Falls     Seat Belt Use: yes  Comments: given condoms   Current Allergies (reviewed today): ! SULFA ! PENICILLIN ! * BLUE DYE Vital Signs:  Patient profile:   50 year old female Menstrual status:  regular Height:      62 inches (157.48 cm) Weight:      228.5 pounds (103.86 kg) BMI:     41.94 Temp:     98.1 degrees F (36.72 degrees C) oral Pulse rate:   92 / minute BP sitting:   129 / 83  (left arm) Cuff size:   large  Vitals Entered By: [name] RN (December 01, 2009 9:25 AM) CC: follow-up visit, went to [facility] ED 11/26/09, dxed w./ bronchitis, given meds., was not given anything for cough which is keeping her awake at night Is Patient Diabetic? No Pain Assessment Patient in pain? yes     Location: lower back Intensity: 10 Type: aching Onset of pain  Constant Nutritional Status BMI of > 30 = obese Nutritional Status Detail appetite  "good"  Have you  ever been in a relationship where you felt threatened, hurt or afraid?No   Does patient need assistance? Functional Status Self care Ambulation Normal Comments no missed doses of rxes   Physical Exam  General:  alert and overweight-appearing.   Mouth:  pharynx pink and moist, no erythema, no exudates, and poor dentition.  teeth missing.   Lungs:  normal breath sounds.  no crackles and no wheezes.   Heart:  normal rate, regular rhythm, and no murmur.   Psych:  withdrawn and tearful.  she is very defensive when questioned about taking her medications correctly.    Impression & Recommendations:  Problem # 1:  HIV DISEASE (ICD-042) Jenna Watts desperately needs medication adherence counseling but she is currently refusing any assistance.  She says she uses a pill box but I am skeptical about her ability to obtain and take her medications correctly.  Her elevated viral load and recent genotype without resistance mutations would suggest that she is taking little or no medication currently.  If she continues on this course her infection will progressed and she will probably develop resistance.  She has a home care nurse who I will try to enlist for medication adherence assistance. Diagnostics Reviewed:  HIV: HIV positive - not AIDS (  02/28/2008)   CD4: 490 (11/13/2009)   WBC: 6.3 (11/12/2009)   Hgb: 11.3 (11/12/2009)   HCT: 36.5 (11/12/2009)   Platelets: 361 (11/12/2009) HIV genotype: * (11/17/2009)   HIV-1 RNA: 2460 (11/12/2009)   HBSAg: No (10/09/2006)  Other Orders: Est. Patient Level III (28413) Future Orders: T-CD4SP ([facility]) (CD4SP) ... 01/12/2010 T-HIV Viral Load [phone]) ... 01/12/2010  Patient Instructions: 1)  Please schedule a follow-up appointment in 2 months.  Prescriptions: NORVIR 100 MG TABS (RITONAVIR) Take 1 tablet by mouth once a day  #30 x 11   Entered and Authorized by:   Cliffton Asters MD   Signed by:   Cliffton Asters MD on 12/01/2009   Method used:    Electronically to        [facility] [address] [phone]* (retail)       [address]       [place], [place]  [address]       Ph: [phone]       Fax: [phone]   RxID:   [id number] REYATAZ 300 MG CAPS (ATAZANAVIR SULFATE) Take 1 capsule by mouth once a day  #30 x 11   Entered and Authorized by:   Cliffton Asters MD   Signed by:   Cliffton Asters MD on 12/01/2009   Method used:   Electronically to        [facility] [phone]* (retail)       [address]       [place], [place]  [address]       Ph: [phone]       Fax: [phone]   RxID:   [id number] VIREAD 300 MG TABS (TENOFOVIR DISOPROXIL FUMARATE) Take 1 tablet by mouth once a day  #30 x 11   Entered and Authorized by:   Cliffton Asters MD   Signed by:   Cliffton Asters MD on 12/01/2009   Method used:   Electronically to        [facility] [phone]* (retail)       [address]       [place], [place]  [address]       Ph: [phone]       Fax: [phone]   RxID:   [id number] COMBIVIR 150-300 MG TABS (LAMIVUDINE-ZIDOVUDINE) Take 1 tablet by mouth two times a day  #60 x 11   Entered and Authorized by:   Cliffton Asters MD   Signed by:   Cliffton Asters MD on 12/01/2009   Method used:   Electronically to        [facility] [phone]* (retail)       [address]       [place], [place]  [address]       Ph: [phone]       Fax: [phone]   RxID:   [id number]

## 2010-09-16 NOTE — Assessment & Plan Note (Signed)
Summary: f/u appt. /dde   CC:  SOB with walking to bus stopp.  Has been sick since last Saturday, coughing up [place] mucous.  Neck is also bothering her, and rotation is stiff.  History of Present Illness: Jenna Watts is in for her routine visit.  She turns 50 next week.  The last two weeks she has been bothered by sinus congestion, low-grade fevers, and some shortness of breath with exertion. She has had some dry cough and has been using some cough syrup from the drugstore without much relief. She has her HIV medications with her and appears to be taking them correctly and consistently.  She is still using her pill box and denies missing a single dose.  She has not been sexually active since her last visit.  Preventive Screening-Counseling & Management  Alcohol-Tobacco     Alcohol drinks/day: 0     Smoking Status: never     Passive Smoke Exposure: no  Caffeine-Diet-Exercise     Caffeine use/day: yes, one glass of tea per day     Does Patient Exercise: yes     Type of exercise: walking     Exercise (avg: min/session): <30     Times/week: 6  Hep-HIV-STD-Contraception     HIV Risk: no risk noted     HIV Risk Counseling: not indicated-no HIV risk noted  Safety-Violence-Falls     Seat Belt Use: yes  Comments: given condoms       Sexual History:  currently monogamous.        Drug Use:  never.     Prior Medication List:  COMBIVIR 150-300 MG TABS (LAMIVUDINE-ZIDOVUDINE) Take 1 tablet by mouth two times a day VIREAD 300 MG TABS (TENOFOVIR DISOPROXIL FUMARATE) Take 1 tablet by mouth once a day REYATAZ 300 MG CAPS (ATAZANAVIR SULFATE) Take 1 capsule by mouth once a day NORVIR 100 MG TABS (RITONAVIR) Take 1 tablet by mouth once a day ADVIL 200 MG TABS (IBUPROFEN) Take 1 tablet by mouth two times a day as needed for pain   Current Allergies (reviewed today): ! SULFA ! PENICILLIN ! * BLUE DYE Social History: Drug Use:  never  Vital Signs:  Patient profile:   50 year old  female Menstrual status:  regular Height:      62 inches (157.48 cm) Weight:      246.5 pounds (112.05 kg) BMI:     45.25 Temp:     98.2 degrees F (36.78 degrees C) oral Pulse rate:   63 / minute BP sitting:   137 / 96  (left arm) Cuff size:   large  Vitals Entered By: [name] RN (August 24, 2010 10:22 AM) CC: SOB with walking to bus stopp.  Has been sick since last Saturday, coughing up [place] mucous.  Neck is also bothering her, rotation is stiff Is Patient Diabetic? No Pain Assessment Patient in pain? yes     Location: chest Intensity: 10 Type: SOB Onset of pain  With activity Nutritional Status BMI of > 30 = obese Nutritional Status Detail appetite "real good"  Have you ever been in a relationship where you felt threatened, hurt or afraid?No   Does patient need assistance? Functional Status Self care Ambulation Normal Comments no missed doses of rxes   Physical Exam  General:  alert and overweight-appearing.   Mouth:  she recently got her new dentures and is very happy and proud. Breasts:  normal Lungs:  normal breath sounds.  no crackles and no wheezes.  Heart:  normal rate, regular rhythm, and no murmur.   Abdomen:  soft, non-tender, normal bowel sounds, no distention, and no masses.   Skin:  no rashes.   Axillary Nodes:  no R axillary adenopathy and no L axillary adenopathy.   Psych:  normally interactive, good eye contact, not anxious appearing, and not depressed appearing.          Medication Adherence: 08/24/2010   Adherence to medications reviewed with patient. Counseling to provide adequate adherence provided   Prevention For Positives: 08/24/2010   Safe sex practices discussed with patient. Condoms offered.                             Impression & Recommendations:  Problem # 1:  HIV DISEASE (ICD-042) I will repeat her viral load and if it is still elevated I will repeat the genotype resistance test.  She will continue her current  regimen for now. Diagnostics Reviewed:  HIV: HIV positive - not AIDS (02/28/2008)   CD4: 510 (06/22/2010)   WBC: 5.0 (04/12/2010)   Hgb: 10.3 (04/12/2010)   HCT: 34.1 (04/12/2010)   Platelets: 374 (04/12/2010) HIV genotype: See Comment (06/24/2010)   HIV-1 RNA: 1120 (06/21/2010)   HBSAg: No (10/09/2006)  Problem # 2:  UPPER RESPIRATORY INFECTION, ACUTE (ICD-465.9) She has a mild upper respiratory infection.  I will have her use generic Afrin nasal spray for the next few days for her prominent sinus congestion. Her mild dyspnea on exertion is probably related to her obesity, deconditioning and the upper respiratory infection.  She had a normal EKG and echocardiogram one year ago. Her updated medication list for this problem includes:    Advil 200 Mg Tabs (Ibuprofen) .Marland Kitchen... Take 1 tablet by mouth two times a day as needed for pain  Orders: Est. Patient Level IV (04540)  Problem # 3:  HYPERTENSION (ICD-401.9) She is reluctant to start any new medications for her blood pressure but I told her that if it does not improve that this will need to be addressed in the next few months. Orders: Est. Patient Level IV (99214)  BP today: 137/96 Prior BP: 131/87 (04/12/2010)  Labs Reviewed: K+: 4.0 (04/12/2010) Creat: : 0.89 (04/12/2010)   Chol: 132 (09/03/2009)   HDL: 47 (09/03/2009)   LDL: 68 (09/03/2009)   TG: 86 (09/03/2009)  Other Orders: T-HIV Viral Load (98119-14782) T-Comprehensive Metabolic Panel (95621-30865) T-CBC w/Diff (78469-62952) T-HIV Genotype (84132-44010) T-RPR (Syphilis) (27253-66440) T-Lipid Profile (34742-59563)  Patient Instructions: 1)  Please schedule a follow-up appointment in 1 month.

## 2010-09-16 NOTE — Assessment & Plan Note (Signed)
Summary: est-ck/fu/med/cfb   CC:  follow-up visit, having problem sleeping at night due to the pain, happening since the last time she was here, and I haven't had a phone to call about the problem.  History of Present Illness: Jenna Watts is in today for her routine visit.  She is currently not on any medications.  She hands me a thick package papers from several recent emergency department visits at [facility].  Her last visit was in October.  She had been there for her chest discomfort and was told she had bronchitis and also once when she was told that she had a urinary tract infection.  She is not yet established herself with a primary care physician in [facility] but knows that she could go to the community clinic downtown.  She says she has been bothered by shortness of breath when walking up hills over the last 3 months.  She does not seem to have shortness of breath at other times.  She initially denied having any chest pain or pressure.  However, when I asked her if she and her boyfriend had been sexually active she says that she doesn't like to have sex because she gets short of breath and has chest pain.  He says that when the do have sex he always uses condoms and she was given a new supply today. Flu Vaccine Consent Questions     Do you have a history of severe allergic reactions to this vaccine? no    Any prior history of allergic reactions to egg and/or gelatin? no    Do you have a sensitivity to the preservative Thimersol? no    Do you have a past history of Guillan-Barre Syndrome? no    Do you currently have an acute febrile illness? no    Have you ever had a severe reaction to latex? no    Vaccine information given and explained to patient? yes    Are you currently pregnant? no    Lot [id number]   Exp Date:11/12/2009   Manufacturer: [employer]    Site Given  Left Deltoid IM Jenna Maduro RN  September 03, 2009 3:41 PM  Preventive Screening-Counseling &  Management  Alcohol-Tobacco     Alcohol drinks/day: 0     Smoking Status: never     Passive Smoke Exposure: no  Caffeine-Diet-Exercise     Caffeine use/day: yes, one glass of tea per day     Does Patient Exercise: yes     Type of exercise: walking     Exercise (avg: min/session): <30     Times/week: 6  Hep-HIV-STD-Contraception     HIV Risk: no risk noted  Safety-Violence-Falls     Seat Belt Use: yes  Comments: given condoms      Sexual History:  currently monogamous and 4 years in March.        Drug Use:  never.     Updated Prior Medication List: No Medications Current Allergies (reviewed today): ! SULFA ! PENICILLIN ! * BLUE DYE Social History: Sexual History:  currently monogamous, 4 years in March  Review of Systems  The patient denies anorexia, fever, weight loss, syncope, prolonged cough, and abdominal pain.    Vital Signs:  Patient profile:   50 year old female Menstrual status:  regular LMP:     08/28/2009 Height:      62 inches (157.48 cm) Weight:      230.9 pounds Temp:     97.0 degrees F oral  Pulse rate:   86 / minute BP sitting:   153 / 89  (left arm) Cuff size:   large  Vitals Entered By: Jenna Maduro RN (September 03, 2009 3:29 PM) CC: follow-up visit, having problem sleeping at night due to the pain, happening since the last time she was here, I haven't had a phone to call about the problem Is Patient Diabetic? No Pain Assessment Patient in pain? yes     Location: central chest and bilateral arms from shoulders to hands Intensity: 10 Type: sharp Onset of pain  Constant Nutritional Status BMI of > 30 = obese Nutritional Status Detail appetite "up and down"  Have you ever been in a relationship where you felt threatened, hurt or afraid?No   Does patient need assistance? Functional Status Self care Ambulation Normal LMP (date): 08/28/2009 LMP - Character: normal     Enter LMP: 08/28/2009 Last PAP Result UNSATISFACTORY FOR  EVALUATION.  THE SPECIMEN IS PROCESSED   Physical Exam  General:  alert, overweight-appearing, and pale.   Mouth:  pharynx pink and moist, no erythema, no exudates, and poor dentition.  teeth missing.   Chest Wall:  no tenderness.   Lungs:  normal breath sounds.  no crackles and no wheezes.   Heart:  normal rate, regular rhythm, and no murmur.   Abdomen:  soft, non-tender, normal bowel sounds, and no masses.   Skin:  no rashes.   Axillary Nodes:  no R axillary adenopathy and no L axillary adenopathy.   Psych:  normally interactive, good eye contact, not anxious appearing, and not depressed appearing.      Impression & Recommendations:  Problem # 1:  HIV DISEASE (ICD-042) Her CD4 count has been very stable over many years off of therapy.  She was on dual on monotherapy many many years ago.  She has had difficulty adhering to various medications.  However, it would probably be in her best interest to consider starting therapy again in the near future.  I will try to get her established with primary care close to her home in [facility] so that we can focus on her HIV care. Diagnostics Reviewed:  HIV: HIV positive - not AIDS (02/28/2008)   CD4: 340 (04/22/2009)   WBC: 5.9 (11/24/2008)   Hgb: 11.0 (11/24/2008)   HCT: 34.6 (11/24/2008)   Platelets: 284 (11/24/2008) HIV genotype: See Comment (01/15/2008)   HIV-1 RNA: 49000 (04/21/2009)   HBSAg: No (10/09/2006)  Problem # 2:  CHEST PAIN, INTERMITTENT (ICD-786.50) I am not completely confident in the history I obtained today.  Despite asking many times and in many different ways I am not exactly certain when she has chest pain, how frequently or in what settings.  Her 12-lead EKG is completely normal today.  I will obtain a transthoracic echocardiogram to look for any wall motion abnormalities, estimated pressures and ejection fraction. Orders: Est. Patient Level IV (16109) 12 Lead EKG (12 Lead EKG) 2 D Echo (2 D Echo)  Problem # 3:  DYSPNEA  ON EXERTION (ICD-786.09) I am not sure if her dyspnea on exertion is an anginal equivalent or if it simply reflects her obesity and poor conditioning. I will check a transthoracic echocardiogram as noted above. The following medications were removed from the medication list:    Proventil Hfa 108 (90 Base) Mcg/act Aers (Albuterol sulfate) ..... Inhale one to tow puffs by mouth every 4 to 6 hours as needed  Orders: Est. Patient Level IV (60454) 12 Lead EKG (12 Lead  EKG) 2 D Echo (2 D Echo)  Other Orders: Admin 1st Vaccine (04540) Flu Vaccine 66yrs + [phone]) T-CD4SP [facility]) (CD4SP) T-HIV Viral Load [phone]) T-Lipid Profile [phone]) T-Comprehensive Metabolic Panel [phone]) T-CBC w/Diff (44010-27253) T-RPR (Syphilis) (66440-34742)  Patient Instructions: 1)  Please schedule a follow-up appointment in 2 weeks.  Process Orders Check Orders Results:     [facility]: ABN not required for this insurance Tests Sent for requisitioning (September 03, 2009 4:45 PM):     09/03/2009: [facility] -- T-HIV Viral Load [phone] (signed)     09/03/2009: [facility] -- T-Lipid Profile [phone] (signed)     09/03/2009: [facility] -- T-Comprehensive Metabolic Panel [[id number]] (signed)     09/03/2009: [facility] -- T-CBC w/Diff [[id number]] (signed)     09/03/2009: [facility] -- T-RPR (Syphilis) [phone] (signed)

## 2010-09-16 NOTE — Miscellaneous (Signed)
Summary: [facility] referral per Dr. Orvan Falconer  Clinical Lists Changes  Orders: Added new Referral order of Misc. Referral (Misc. Ref) - Signed

## 2010-09-16 NOTE — Assessment & Plan Note (Signed)
Summary: 1 month f/u [mkj]   CC:  follow-up visit and pt. c/o bilateral arm pain.  History of Present Illness: Jenna Watts is in with her boyfriend, Jenna Watts, for her routine visit.  Her right foot pain and indigestion resolved shortly after her last visit.  Her only current problem is some aching pain in her elbows.  That is relieved by taking two Advil every morning.  She does not know the names of her HIV medications but can describe taking them correctly and denies missing a single dose.  She feels out her pill box every Sunday.  Preventive Screening-Counseling & Management  Alcohol-Tobacco     Alcohol drinks/day: 0     Smoking Status: never     Passive Smoke Exposure: no  Caffeine-Diet-Exercise     Caffeine use/day: yes, one glass of tea per day     Does Patient Exercise: yes     Type of exercise: walking     Exercise (avg: min/session): <30     Times/week: 6  Hep-HIV-STD-Contraception     HIV Risk: no risk noted  Safety-Violence-Falls     Seat Belt Use: yes      Sexual History:  currently monogamous.        Drug Use:  former.    Comments: pt given condoms   Updated Prior Medication List: COMBIVIR 150-300 MG TABS (LAMIVUDINE-ZIDOVUDINE) Take 1 tablet by mouth two times a day VIREAD 300 MG TABS (TENOFOVIR DISOPROXIL FUMARATE) Take 1 tablet by mouth once a day REYATAZ 300 MG CAPS (ATAZANAVIR SULFATE) Take 1 capsule by mouth once a day NORVIR 100 MG TABS (RITONAVIR) Take 1 tablet by mouth once a day ADVIL 200 MG TABS (IBUPROFEN) Take 1 tablet by mouth two times a day as needed for pain  Current Allergies (reviewed today): ! SULFA ! PENICILLIN ! * BLUE DYE Vital Signs:  Patient profile:   49 year old female Menstrual status:  regular Height:      62 inches (157.48 cm) Weight:      231.8 pounds (105.36 kg) BMI:     42 .55 Pulse rate:   65 / minute BP sitting:   137 / 88  (left arm)  Vitals Entered By: Wendall Mola CMA Duncan Dull) (March 23, 2010 8:44 AM) CC:  follow-up visit, pt. c/o bilateral arm pain Is Patient Diabetic? No Pain Assessment Patient in pain? yes     Location: arms Intensity: 10 Type: aching Onset of pain  Constant Nutritional Status BMI of > 30 = obese Nutritional Status Detail appetite "good"  Does patient need assistance? Functional Status Self care Ambulation Normal Comments no missed doses of meds per patient   Physical Exam  General:  alert and overweight-appearing.   Mouth:  she recently got her new dentures and is very happy and proud. Lungs:  normal breath sounds.  no crackles and no wheezes.   Heart:  normal rate, regular rhythm, and no murmur.   Msk:  examination of her elbows arms and wrists is completely normal.        Medication Adherence: 03/23/2010   Adherence to medications reviewed with patient. Counseling to provide adequate adherence provided                                Impression & Recommendations:  Problem # 1:  HIV DISEASE (ICD-042) Jenna Watts is doing extremely well with her new regimen.  I will not make any changes today.  Diagnostics Reviewed:  HIV: HIV positive - not AIDS (02/28/2008)   CD4: 480 (01/20/2010)   WBC: 6.3 (11/12/2009)   Hgb: 11.3 (11/12/2009)   HCT: 36.5 (11/12/2009)   Platelets: 361 (11/12/2009) HIV genotype: * (11/17/2009)   HIV-1 RNA: <48 copies/mL (01/19/2010)   HBSAg: No (10/09/2006)  Medications Added to Medication List This Visit: 1)  Advil 200 Mg Tabs (Ibuprofen) .... Take 1 tablet by mouth two times a day as needed for pain  Other Orders: Est. Patient Level III (16109) Future Orders: T-CD4SP ([facility]) (CD4SP) ... 06/21/2010 T-HIV Viral Load [phone]) ... 06/21/2010  Patient Instructions: 1)  Please schedule a follow-up appointment in 3 months.

## 2010-09-16 NOTE — Letter (Signed)
Summary: [facility]  [facility]   Imported By: Florinda Marker 09/01/2009 14:13:20  _____________________________________________________________________  External Attachment:    Type:   Image     Comment:   External Document

## 2010-09-16 NOTE — Progress Notes (Signed)
Summary: Bilateral breast pain, appt. scheduled, req. referral to [facility]  Phone Note Call from Patient Call back at [facility] Phone [phone]   Caller: Patient Reason for Call: Acute Illness Action Taken: Phone Call Completed, Appt Scheduled Summary of Call: Seen at [facility] ED for bilateral breast pain.  Was instructed to call her PCP for f/u.  Pt. had called [facility] to make an appt. but was told she would need to be referred.  Appt. made with Dr. Daiva Eves for 04/12/10.  [name] RN  April 02, 2010 9:42 AM

## 2010-09-16 NOTE — Assessment & Plan Note (Signed)
Summary: bilateral breast pain, seen @ [facility] /dde   CC:  bilateral breast pain.  History of Present Illness: Jenna Watts is a 50 y/o F with PMH outlined in the EMR who presents today with a c/c of breast pain.  Pt reports 2 weeks of 10/10, constant bilateral breast pain that is worse with movement and palpation.  She states the pain woke her from sleep became so severe she went to the ER (03/29/10)for evaluation and was prescribed tramadol for relief.  The tramadol has helped significantly and she is now without pain.  Last LMP was 04/03/10 and lasted for 6 days.  She denies any nipple discharge, abnormal breast lumps/bumps/lesions, redness/swelling in her breasts, fevers, N/V/D, or other complaint.  SHe did not sustain any recent trauma or falls.    She states she is taking her ART every day and has not missed a single dose; she is able to describe her medication regimen.  She denies any adverse effects from her meds and reports using codoms every time she has sex with her boyfriend.    She states she is otherwise doing very well and is without any other concern/complaints.   Updated Prior Medication List: COMBIVIR 150-300 MG TABS (LAMIVUDINE-ZIDOVUDINE) Take 1 tablet by mouth two times a day VIREAD 300 MG TABS (TENOFOVIR DISOPROXIL FUMARATE) Take 1 tablet by mouth once a day REYATAZ 300 MG CAPS (ATAZANAVIR SULFATE) Take 1 capsule by mouth once a day NORVIR 100 MG TABS (RITONAVIR) Take 1 tablet by mouth once a day ADVIL 200 MG TABS (IBUPROFEN) Take 1 tablet by mouth two times a day as needed for pain  Current Allergies (reviewed today): ! SULFA ! PENICILLIN ! * BLUE DYE Past History:  Past medical, surgical, family and social histories (including risk factors) reviewed for relevance to current acute and chronic problems.  Past Medical History: Reviewed history from 10/18/2007 and no changes required. HIV disease--asymptomatic; dx 7/96 Obesity GERD Mental Retardation-probable Pruritis,  itching Abnormal Pap Smears, hx of Carpal Tunnel  Syndrome-bilateral; LT wrist injection 2002 Dental Caries Tobacco Use-hx of cigarette smoking;  quit 1994 Dyspepsia--Helicobacter AB+;  3 drug regimen 1999 Alcohol Abuse--hx of heavy use; quit 1994 Hypertension  Family History: Reviewed history and no changes required.  Social History: Reviewed history and no changes required. Pt enrolled in GED classes (03/2009) that will continue into fall. Lives alone.  Vital Signs:  Patient profile:   51 year old female Menstrual status:  regular Height:      62 inches (157.48 cm) Weight:      232 pounds (105.45 kg) BMI:     42.59 Temp:     97.9 degrees F (36.61 degrees C) oral BP sitting:   131 / 87  (left arm)  Vitals Entered By: Starleen Arms CMA (April 12, 2010 3:02 PM) CC: bilateral breast pain Pain Assessment Patient in pain? no      Nutritional Status BMI of 25 - 29 = overweight Nutritional Status Detail nl  Does patient need assistance? Functional Status Self care Ambulation Normal   Physical Exam  General:  alert and overweight-appearing.   Head:  normocephalic and atraumatic.   Eyes:  vision grossly intact.  PERRLA.  EOMI.  Anicteric. Mouth:  Dentures in place.  No erythema, exudate, or abnormal lesions. Neck:  supple and full ROM.   Chest Wall:  no deformities, no tenderness, and no mass.   Breasts:  skin/areolae normal, no masses, no abnormal thickening, no nipple discharge, no tenderness, and no  adenopathy.   Lungs:  normal breath sounds.  no crackles and no wheezes.   Heart:  normal rate, regular rhythm, and no murmur.   Abdomen:  soft, non-tender, normal bowel sounds, and no masses.   Extremities:  No clubbing, cyanosis, or edema. +2 pulses globally. Neurologic:  alert & oriented X3, cranial nerves II-XII intact, strength normal in all extremities, and gait normal.   Skin:  turgor normal, color normal, no rashes, no suspicious lesions, no ecchymoses, no  petechiae, and no ulcerations.   Psych:  Oriented X3, memory intact for recent and remote, normally interactive, good eye contact, not anxious appearing, and not depressed appearing.       Impression & Recommendations:  Problem # 1:  HIV DISEASE (ICD-042) Pt reports 100% compliance with ART and is tolerating her meds well.  Will repeat CD4 and VL today as well as CBC and BMET.  Will give flu vaccination today.  She is up to date on all other vaccinations.  Orders: T-Comprehensive Metabolic Panel [phone]) T-CBC w/Diff [phone]) T-CD4 [phone]) T-HIV Viral Load ([id number]) Est. Patient Level IV (36644)  Diagnostics Reviewed:  HIV: HIV positive - not AIDS (02/28/2008)   CD4: 480 (01/20/2010)   WBC: 6.3 (11/12/2009)   Hgb: 11.3 (11/12/2009)   HCT: 36.5 (11/12/2009)   Platelets: 361 (11/12/2009) HIV genotype: * (11/17/2009)   HIV-1 RNA: <48 copies/mL (01/19/2010)   HBSAg: No (10/09/2006)  Problem # 2:  BREAST PAIN, BILATERAL (ICD-611.71) Jenna Watts's symptoms are resolved.  No abnormalities were present on examination of her breasts and chest wall.  Her discomfort may be related to her menstrual cycles; the onset of her pain (03/29/10) is consistent with prementrual breast (first date of LMP 04/03/10) discomfort.  Will obtain a mammogram for thourough evaluation.  Orders: Mammogram (Screening) (Mammo) Est. Patient Level IV (03474)  Problem # 3:  SCREENING FOR MALIGNANT NEOPLASM OF THE CERVIX (ICD-V76.2) Pt is due for her annual pelvic exam and pap smear.  She has a history of atypical cells on pap (2009) and her 2010 exam did not yield an adequate sample for anaylsis.  Jenna Watts prefer referral to gyn for her exam, will refer her today. Orders: Gynecologic Referral (Gyn) Est. Patient Level IV (25956)  Other Orders: Flu Vaccine 73yrs + (38756) Admin 1st Vaccine (43329)  Patient Instructions: 1)  Keep your previously scheduled follow-up appointment with Dr. Orvan Falconer. 2)   We will refer you for a mammogram and for a yearly pelvic exam.  It is very important have both of these things done. 3)  Keep taking your medicines every day.  You are doing a great job! 4)  Good luck in school this fall!   Immunizations Administered:  Influenza Vaccine # 1:    Vaccine Type: Fluvax 3+    Site: right deltoid    Mfr: novartis    Dose: 0.5 ml    Route: IM    Given by: Starleen Arms CMA    Exp. Date: 11/14/2010    Lot #: [id number]    VIS given: 03/08/07 version given April 12, 2010.  Flu Vaccine Consent Questions:    Do you have a history of severe allergic reactions to this vaccine? no    Any prior history of allergic reactions to egg and/or gelatin? no    Do you have a sensitivity to the preservative Thimersol? no    Do you have a past history of Guillan-Barre Syndrome? no    Do you currently have an acute febrile  illness? no    Have you ever had a severe reaction to latex? no    Vaccine information given and explained to patient? yes    Are you currently pregnant? no  Prevention & Chronic Care Immunizations   Influenza vaccine: Fluvax 3+  (04/12/2010)    Tetanus booster: Not documented    Pneumococcal vaccine: Historical  (03/14/2006)  Other Screening   Pap smear: UNSATISFACTORY FOR EVALUATION.  THE SPECIMEN IS PROCESSED  (05/08/2009)   Pap smear action/deferral: GYN Referral  (04/12/2010)   Pap smear due: 11/2008    Mammogram: Normal  (11/28/2007)   Mammogram due: 11/2008   Smoking status: never  (03/23/2010)  Lipids   Total Cholesterol: 132  (09/03/2009)   LDL: 68  (09/03/2009)   LDL Direct: Not documented   HDL: 47  (09/03/2009)   Triglycerides: 86  (09/03/2009)  Hypertension   Last Blood Pressure: 131 / 87  (04/12/2010)   Serum creatinine: 0.72  (11/12/2009)   Serum potassium 4.8  (11/12/2009) CMP ordered   Self-Management Support :    Hypertension self-management support: Not documented   Nursing Instructions: Gyn referral for  screening Pap (see order)    Appended Document: bilateral breast pain, seen @ [facility] /dde I examined the patient with the resident. I agree with the plan as outlined in her note. Pt with HIV well controlled. She has new breast pain. On exam no concerning masses,small nodule in 2 oclock position freely movable. Will get mammogram and refer to gyn who need to see the pt for pap smear as well.

## 2010-09-16 NOTE — Assessment & Plan Note (Signed)
Summary: 2WK F/U/VS   CC:  follow-up visit and since 09/22/2009 has been coughing and now having sternal pain from coughing.  productive cough with some blood in it.  Pt. thinks that it is bronchitis.  Thinks she has been has a fever.   Has taken tylenol for fever symptoms.  Did not take her temperature.Marland Kitchen  History of Present Illness: Jenna Watts is in for her routine visit.  She continues to have some intermittent problem with cough she describes as bronchitis.  She has stable, mild dyspnea on exertion.  She has not had any fever chills or sweats.  The only person that she is around smoke cigarettes is her brother and she tries to avoid him when he is smoking.  She is currently on no medications.  Preventive Screening-Counseling & Management  Alcohol-Tobacco     Alcohol drinks/day: 0     Smoking Status: never     Passive Smoke Exposure: no  Caffeine-Diet-Exercise     Caffeine use/day: yes, one glass of tea per day     Does Patient Exercise: yes     Type of exercise: walking     Exercise (avg: min/session): <30     Times/week: 6  Hep-HIV-STD-Contraception     HIV Risk: no risk noted  Safety-Violence-Falls     Seat Belt Use: yes  Comments: given condoms      Sexual History:  haqs a friend.        Drug Use:  never.     Current Allergies (reviewed today): ! SULFA ! PENICILLIN ! * BLUE DYE Social History: Sexual History:  haqs a friend  Vital Signs:  Patient profile:   50 year old female Menstrual status:  regular LMP:     09/23/2009 Height:      62 inches (157.48 cm) Weight:      228.7 pounds (103.95 kg) BMI:     41.98 Temp:     96.9 degrees F (36.06 degrees C) oral Pulse rate:   76 / minute BP sitting:   139 / 98  (left arm) Cuff size:   large  Vitals Entered By: [name] RN (September 29, 2009 10:01 AM) CC: follow-up visit, since 09/22/2009 has been coughing and now having sternal pain from coughing.  productive cough with some blood in it.  Pt. thinks that it is  bronchitis.  Thinks she has been has a fever.   Has taken tylenol for fever symptoms.  Did not take her temperature. Is Patient Diabetic? No Pain Assessment Patient in pain? yes     Location: chest Intensity: 10 Type: sharp Onset of pain  sharp pain when coughing Nutritional Status BMI of > 30 = obese Nutritional Status Detail appetite "off"  Have you ever been in a relationship where you felt threatened, hurt or afraid?No   Does patient need assistance? Functional Status Self care Ambulation Normal LMP (date): 09/23/2009 LMP - Character: normal     Enter LMP: 09/23/2009 Last PAP Result UNSATISFACTORY FOR EVALUATION.  THE SPECIMEN IS PROCESSED   Physical Exam  General:  alert and overweight-appearing.   Mouth:  pharynx pink and moist, no erythema, no exudates, and poor dentition.  teeth missing.   Lungs:  normal breath sounds.  no crackles and no wheezes.   Heart:  normal rate, regular rhythm, and no murmur.      Impression & Recommendations:  Problem # 1:  HIV DISEASE (ICD-042) I have suggested that she consider restarting anti-retroviral therapy and she agrees.  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  I will start her on to salvage regimen, refer her for adherence counseling and check repeat lab work in 6 weeks. Diagnostics Reviewed:  HIV: HIV positive - not AIDS (02/28/2008)   CD4: 340 (09/04/2009)   WBC: 6.1 (09/03/2009)   Hgb: 11.1 (09/03/2009)   HCT: 34.8 (09/03/2009)   Platelets: 321 (09/03/2009) HIV genotype: See Comment (01/15/2008)   HIV-1 RNA: 54100 (09/03/2009)   HBSAg: No (10/09/2006)  Problem # 2:  DYSPNEA ON EXERTION (ICD-786.09) Her transthoracic echocardiogram was completely normal with an ejection fraction of 55 to 65%.  I suspect that her mild dyspnea on exertion is largely due to deconditioning and obesity. Future Orders: T-CD4SP ([facility]) (CD4SP) ... 11/10/2009  Medications Added to Medication List  This Visit: 1)  Combivir 150-300 Mg Tabs (Lamivudine-zidovudine) .... Take 1 tablet by mouth two times a day 2)  Viread 300 Mg Tabs (Tenofovir disoproxil fumarate) .... Take 1 tablet by mouth once a day 3)  Reyataz 300 Mg Caps (Atazanavir sulfate) .... Take 1 capsule by mouth once a day 4)  Norvir 100 Mg Tabs (Ritonavir) .... Take 1 tablet by mouth once a day  Other Orders: Est. Patient Level III (19147) Future Orders: T-HIV Viral Load ([id number]) ... 11/10/2009 T-Comprehensive Metabolic Panel [phone]) ... 11/10/2009 T-CBC w/Diff ([id number]) ... 11/10/2009  Patient Instructions: 1)  Please schedule a follow-up appointment in 2 months.  Prescriptions: NORVIR 100 MG TABS (RITONAVIR) Take 1 tablet by mouth once a day  #30 x 11   Entered and Authorized by:   Cliffton Asters MD   Signed by:   Cliffton Asters MD on 09/29/2009   Method used:   Electronically to        [facility] [phone]* (retail)       [address]       [place], [place]  [address]       Ph: [phone]       Fax: [phone]   RxID:   [id number] REYATAZ 300 MG CAPS (ATAZANAVIR SULFATE) Take 1 capsule by mouth once a day  #30 x 11   Entered and Authorized by:   Cliffton Asters MD   Signed by:   Cliffton Asters MD on 09/29/2009   Method used:   Electronically to        [facility] [phone]* (retail)       [address]       [place], [place]  [address]       Ph: [phone]       Fax: [phone]   RxID:   [id number] VIREAD 300 MG TABS (TENOFOVIR DISOPROXIL FUMARATE) Take 1 tablet by mouth once a day  #30 x 11   Entered and Authorized by:   Cliffton Asters MD   Signed by:   Cliffton Asters MD on 09/29/2009   Method used:   Electronically to        [facility] [phone]* (retail)       [address]       [place], [place]  [address]       Ph: [phone]       Fax: [phone]   RxID:   [id number] COMBIVIR 150-300 MG TABS (LAMIVUDINE-ZIDOVUDINE) Take 1 tablet by mouth two times a day  #60 x 11    Entered and Authorized by:   Cliffton Asters MD   Signed by:  Cliffton Asters MD on 09/29/2009   Method used:   Electronically to        [facility] [phone]* (retail)       [address]       [place], [place]  [address]       Ph: [phone]       Fax: [phone]   RxID:   [id number]   Appended Document: 2WK F/U/VS Referral to [facility] to assist pt. with learning medications and setting up pillbox before her next visit in 2 months.  Faxed referral

## 2010-09-16 NOTE — Miscellaneous (Signed)
Summary: Orders Update - Lab  Clinical Lists Changes  Orders: Added new Test order of T-HIV1 Quant rflx Ultra or Genotype ([id number]) - Signed Added new Test order of T-HIV Genotype [phone]) - Signed     Process Orders Check Orders Results:     [facility]: ABN not required for this insurance Tests Sent for requisitioning (November 17, 2009 1:15 PM):     11/17/2009: [facility] -- T-HIV Genotype [phone] (signed)

## 2010-09-16 NOTE — Assessment & Plan Note (Signed)
Summary: F/U/VS   CC:  follow-up visit, 3-4 times a day "throws up whitish stuff, and about a cup"  happens after she eats her meal and takes her medicine.  History of Present Illness: Jenna Watts is in for her routine visit.  Selena Batten, her Paramedic, is with her today.  She has all of her medications and is taking them correctly.  She is using her pillbox and denies missing any doses since her last visit except for this morning this doses.  She missed those because she says she did not have a good appetite and had not eaten before coming to clinic.  In the last week she's been having a sour stomach with some nausea and occasional vomiting.  She is concerned it is due to the medication but had no similar problems for the first 3 months of the medication.  She has found that taking antacid tablets helps but says she has only taken about two a day over the last week.  Yesterday she developed sudden pain across the top of her right foot.  She's not had any recent trauma.  She said there was redness and swelling over the dorsum of her foot but the pain swelling and redness have improved today.  Preventive Screening-Counseling & Management  Alcohol-Tobacco     Alcohol drinks/day: 0     Smoking Status: never     Passive Smoke Exposure: no  Caffeine-Diet-Exercise     Caffeine use/day: yes, one glass of tea per day     Does Patient Exercise: yes     Type of exercise: walking     Exercise (avg: min/session): <30     Times/week: 6  Hep-HIV-STD-Contraception     HIV Risk: no risk noted  Safety-Violence-Falls     Seat Belt Use: yes  Comments: given condoms      Sexual History:  currently monogamous.        Drug Use:  former.     Prior Medication List:  COMBIVIR 150-300 MG TABS (LAMIVUDINE-ZIDOVUDINE) Take 1 tablet by mouth two times a day VIREAD 300 MG TABS (TENOFOVIR DISOPROXIL FUMARATE) Take 1 tablet by mouth once a day REYATAZ 300 MG CAPS (ATAZANAVIR SULFATE) Take 1 capsule by  mouth once a day NORVIR 100 MG TABS (RITONAVIR) Take 1 tablet by mouth once a day   Current Allergies (reviewed today): ! SULFA ! PENICILLIN ! * BLUE DYE Social History: Sexual History:  currently monogamous Drug Use:  former  Vital Signs:  Patient profile:   50 year old female Menstrual status:  regular Height:      62 inches (157.48 cm) Weight:      227.75 pounds (103.52 kg) BMI:     41.81 Temp:     97.5 degrees F (36.39 degrees C) oral Pulse rate:   82 / minute BP sitting:   137 / 87  (left arm) Cuff size:   large  Vitals Entered By: [name] RN (February 02, 2010 10:23 AM) CC: follow-up visit, 3-4 times a day "throws up whitish stuff, about a cup"  happens after she eats her meal and takes her medicine Is Patient Diabetic? No Pain Assessment Patient in pain? yes     Location: right foot Intensity: 10 Type: stinging Onset of pain  pressure on "ball" of foot when walking makes it worse, "pins and needles" Nutritional Status BMI of > 30 = obese Nutritional Status Detail appetite "pretty good"  Have you ever been in a relationship where you felt  threatened, hurt or afraid?No   Does patient need assistance? Functional Status Self care Ambulation Normal Comments no missed doses of rxs.  Did not take her rx this AM   Physical Exam  General:  alert and overweight-appearing.   Mouth:  she recently got her new dentures and is very happy and proud. Lungs:  normal breath sounds.  no crackles and no wheezes.   Heart:  normal rate, regular rhythm, and no murmur.   Extremities:  examination of her right foot is entirely normal.  She has strong pulses.  There is no pain with palpation, redness swelling or any other signs of inflammation. Neurologic:  gait normal.   Skin:  no rashes.   Psych:  she is very happy about her lab results.        Medication Adherence: 02/02/2010   Adherence to medications reviewed with patient. Counseling to provide adequate adherence  provided   Prevention For Positives: 02/02/2010   Safe sex practices discussed with patient. Condoms offered.                             Impression & Recommendations:  Problem # 1:  HIV DISEASE (ICD-042) Hher adherence has improved dramatically and as a result her infection is under the best control it has been in the past 10 to 15 years. Diagnostics Reviewed:  HIV: HIV positive - not AIDS (02/28/2008)   CD4: 480 (01/20/2010)   WBC: 6.3 (11/12/2009)   Hgb: 11.3 (11/12/2009)   HCT: 36.5 (11/12/2009)   Platelets: 361 (11/12/2009) HIV genotype: * (11/17/2009)   HIV-1 RNA: <48 copies/mL (01/19/2010)   HBSAg: No (10/09/2006)  Problem # 2:  GERD (ICD-530.81) I suspect her recent mild nausea and vomiting is due to a flare of her acid reflux.  I told her that she can increase the amount of antacid tablets he takes a daily.  I will see her back next month to see him is an H2 blocker or proton pump inhibitor. Orders: Est. Patient Level IV (16109)  Problem # 3:  FOOT PAIN, RIGHT (ICD-729.5) I do not know the cause of her sudden right foot pain but appears to be resolving spontaneously.  I will see her back in one month.  Patient Instructions: 1)  Please schedule a follow-up appointment in 1 month.

## 2010-09-16 NOTE — Miscellaneous (Signed)
Summary: Bridge Counselor  Clinical Lists Changes [facility] tranported ct to [facility] on 12/29/09. Pt was able to pick them up on 01/01/10. She has new teeth!!! Pt also called [facility] to report that she was out of medication on 12/31/09. Pt ran out of meds on the correct day at the correct time. [facility] called [facility] and confirmed that pt's meds would be delivered on 01/01/10 before 12pm. Pt then called BC on 01/01/10 and let BC know that she had received her meds from [facility] and had taken her am medications and filled up her pillbox. Pt doing VERY well!  Sharol Roussel  Jan 06, 2010 1:02 PM

## 2010-09-16 NOTE — Progress Notes (Signed)
  Phone Note Outgoing Call   Call placed by: Sharol Roussel Summary of Call: patient enrolled for bridge counseling on 10/07/09

## 2010-09-16 NOTE — Miscellaneous (Signed)
Summary: Bridge Counselor  Clinical Lists Changes BC transporte pt to her lab appointment on 01/19/10.  Sharol Roussel  January 21, 2010 4:33 PM

## 2010-09-16 NOTE — Miscellaneous (Signed)
  WillAs were the genotype to waitClinical Lists Changes  Orders: Added new Test order of T-HIV Genotype [phone]) - Signed

## 2010-09-16 NOTE — Letter (Signed)
Summary: [facility]: [facility] Referral  [facility]: [facility] Referral   Imported By: Florinda Marker 10/01/2009 09:19:37  _____________________________________________________________________  External Attachment:    Type:   Image     Comment:   External Document

## 2010-09-16 NOTE — Progress Notes (Signed)
Summary: [facility] ID: HIV Care Summary  [facility] ID: HIV Care Summary   Imported By: Florinda Marker 09/29/2009 14:58:43  _____________________________________________________________________  External Attachment:    Type:   Image     Comment:   External Document

## 2010-09-16 NOTE — Miscellaneous (Signed)
Summary: Bridge Counselor  Clinical Lists Changes Pt enrolled into the [facility] program on 10/07/09. [facility] transported pt to her lab appointment on 11/12/09. BC transported pt to her MD appointment on 12/01/09. Upon learning that pt was non compliant with her meds, BC made a home visit on 12/01/09 to provide treatment adherence. BC took pt to [facility] to pick up refills on all her meds. Pt had a bottle of Reyataz from 09/29/09 that was never picked up. BC took pt a pillbox and instructed pt how to fill it and then watched her fill it. All four medications are present and in the pillbox. BC had pt flush the old medication that had various unknown amounts left in the bottles. BC printed out a medicine scheduler for pt's refridgerator that had pictures of each medication and when and how to take them. BC discussed all four medications in detail with the pt. Pt had no other questions this date. BC also referred pt to [facility] to ensure that all medicaitons are delivered regularly. Pt was enrolled into [facility] on 12/21/09 and will begin receiving her medications from them next week. Pt called BC on 12/15/09 to tell [facility] that her pillbox was full again for another week. Pt was proud of herself. BC will transport pt to her follow up appointments in June to make sure that pt is doing ok with her medications.  Selena Batten [facility]  Dec 25, 2009 2:17 PM

## 2010-10-26 LAB — T-HELPER CELL (CD4) - (RCID CLINIC ONLY)
CD4 % Helper T Cell: 16 % — ABNORMAL LOW (ref 33–55)
CD4 T Cell Abs: 510 uL (ref 400–2700)

## 2010-11-01 LAB — T-HELPER CELL (CD4) - (RCID CLINIC ONLY)
CD4 % Helper T Cell: 18 % — ABNORMAL LOW (ref 33–55)
CD4 T Cell Abs: 480 uL (ref 400–2700)

## 2010-11-05 LAB — T-HELPER CELL (CD4) - (RCID CLINIC ONLY): CD4 T Cell Abs: 490 uL (ref 400–2700)

## 2010-11-19 LAB — POCT PREGNANCY, URINE: Preg Test, Ur: NEGATIVE

## 2010-11-19 LAB — T-HELPER CELL (CD4) - (RCID CLINIC ONLY): CD4 T Cell Abs: 340 uL — ABNORMAL LOW (ref 400–2700)

## 2010-11-24 LAB — T-HELPER CELL (CD4) - (RCID CLINIC ONLY): CD4 % Helper T Cell: 13 % — ABNORMAL LOW (ref 33–55)

## 2010-12-02 ENCOUNTER — Encounter: Payer: Self-pay | Admitting: Internal Medicine

## 2010-12-03 ENCOUNTER — Other Ambulatory Visit: Payer: Self-pay | Admitting: *Deleted

## 2010-12-03 DIAGNOSIS — B20 Human immunodeficiency virus [HIV] disease: Secondary | ICD-10-CM

## 2010-12-03 MED ORDER — RITONAVIR 100 MG PO TABS: 100.0000 mg | ORAL_TABLET | Freq: Every day | ORAL | Status: DC

## 2010-12-03 MED ORDER — LAMIVUDINE-ZIDOVUDINE 150-300 MG PO TABS: 1.0000 | ORAL_TABLET | Freq: Two times a day (BID) | ORAL | Status: DC

## 2010-12-03 MED ORDER — TENOFOVIR DISOPROXIL FUMARATE 300 MG PO TABS: 300.0000 mg | ORAL_TABLET | Freq: Every day | ORAL | Status: DC

## 2010-12-03 MED ORDER — ATAZANAVIR SULFATE 300 MG PO CAPS: 300.0000 mg | ORAL_CAPSULE | Freq: Every day | ORAL | Status: DC

## 2011-01-26 ENCOUNTER — Other Ambulatory Visit: Payer: Self-pay

## 2011-01-26 DIAGNOSIS — B20 Human immunodeficiency virus [HIV] disease: Secondary | ICD-10-CM

## 2011-01-26 DIAGNOSIS — Z113 Encounter for screening for infections with a predominantly sexual mode of transmission: Secondary | ICD-10-CM

## 2011-02-09 ENCOUNTER — Ambulatory Visit: Payer: Self-pay | Admitting: Internal Medicine

## 2011-02-15 ENCOUNTER — Encounter: Payer: Self-pay | Admitting: Internal Medicine

## 2011-02-15 ENCOUNTER — Ambulatory Visit (INDEPENDENT_AMBULATORY_CARE_PROVIDER_SITE_OTHER): Payer: Medicaid Other | Admitting: Internal Medicine

## 2011-02-15 VITALS — BP 137/88 | HR 60 | Temp 97.5°F | Ht 62.0 in | Wt 247.5 lb

## 2011-02-15 DIAGNOSIS — Z23 Encounter for immunization: Secondary | ICD-10-CM

## 2011-02-15 DIAGNOSIS — B20 Human immunodeficiency virus [HIV] disease: Secondary | ICD-10-CM

## 2011-02-15 NOTE — Progress Notes (Signed)
Addended by: [name] D on: 02/15/2011 09:36 AM   Modules accepted: Orders

## 2011-02-15 NOTE — Progress Notes (Signed)
  Subjective:    Patient ID: Jenna Watts, female    DOB: 12/11/1960, 50 y.o.   MRN: [id number]  HPI [name] is in for her routine visit. She has 2 bottles of Combivir one dated August of 2011 and the other December of 2011 and both are full. She also has a full bottle of Reyataz and Viread but no Norvir.  She initially denied missing any medications but then says she was having trouble swallowing her pills earlier this year and was not taking them regularly. She says she has been taking them regularly over the last few months. She says she uses a pillbox and feels that every Monday. She does not want any help taking her medications.    Review of Systems     Objective:   Physical Exam  Constitutional: No distress.  HENT:  Mouth/Throat: Oropharynx is clear and moist. No oropharyngeal exudate.  Cardiovascular: Normal rate, regular rhythm and normal heart sounds.   Pulmonary/Chest: Breath sounds normal.  Psychiatric: She has a normal mood and affect.          Assessment & Plan:

## 2011-02-15 NOTE — Assessment & Plan Note (Signed)
I continue to be concerned about her ability to adhere to her regimen. Her viral load was elevated to 16,800 in January. The genotype done at that time did not show any resistance mutations suggesting that she was not taking her medications. I have instructed her again today on how to take her medications properly. I will repeat blood work today and have asked her to return next month with all of her pill bottles and her pillbox.

## 2011-02-17 LAB — T-HELPER CELL (CD4) - (RCID CLINIC ONLY): CD4 T Cell Abs: 370 uL — ABNORMAL LOW (ref 400–2700)

## 2011-03-21 ENCOUNTER — Encounter: Payer: Self-pay | Admitting: Internal Medicine

## 2011-03-21 ENCOUNTER — Ambulatory Visit (INDEPENDENT_AMBULATORY_CARE_PROVIDER_SITE_OTHER): Payer: Medicaid Other | Admitting: Internal Medicine

## 2011-03-21 VITALS — BP 157/97 | HR 74 | Temp 98.1°F | Ht 62.0 in | Wt 250.0 lb

## 2011-03-21 DIAGNOSIS — B20 Human immunodeficiency virus [HIV] disease: Secondary | ICD-10-CM

## 2011-03-21 NOTE — Assessment & Plan Note (Signed)
If her story about being off all of her medications for the month of June is correct and that could explain her viral load of 30,400 and decrease in CD4 count is 370. I will repeat her labs today. If her viral load is still up I will repeat a genotype resistance testing.

## 2011-03-21 NOTE — Progress Notes (Signed)
  Subjective:    Patient ID: Jenna Watts, female    DOB: 1961-08-01, 50 y.o.   MRN: [id number]  HPI Jenna Watts is in for her routine visit. She states she's been having chest pain continuously for the last 5 days. When asked where it is located she points to the lower end of her sternum. Pressure over that area causes the pain. It is not exertional. The pain does not spread to any other area and is not associated with any shortness of breath. She has not tried taking anything for it. She denies having any heartburn.  She has all of her medications with her. She states that she did not take any of her medicines in June because they gave her a bad taste in her mouth. Without changing anything about how she takes them she restarted them in July and has tolerated them well since. She appears to be taking them correctly except for the fact that she takes 2 Combivir each morning.     Review of Systems     Objective:   Physical Exam  Constitutional: No distress.  HENT:  Mouth/Throat: Oropharynx is clear and moist. No oropharyngeal exudate.  Cardiovascular: Normal rate, regular rhythm and normal heart sounds.   No murmur heard. Pulmonary/Chest: Breath sounds normal. She has no wheezes. She has no rales.  Psychiatric: She has a normal mood and affect.          Assessment & Plan:

## 2011-03-22 LAB — T-HELPER CELL (CD4) - (RCID CLINIC ONLY): CD4 T Cell Abs: 440 uL (ref 400–2700)

## 2011-03-23 LAB — HIV-1 RNA QUANT-NO REFLEX-BLD
HIV 1 RNA Quant: 5030 copies/mL — ABNORMAL HIGH (ref <20)
HIV-1 RNA Quant, Log: 3.7 {Log} — ABNORMAL HIGH (ref <1.30)

## 2011-05-10 LAB — POCT URINALYSIS DIP (DEVICE)
Glucose, UA: NEGATIVE
Nitrite: POSITIVE — AB
Operator id: 194561
Urobilinogen, UA: 0.2

## 2011-05-11 LAB — T-HELPER CELL (CD4) - (RCID CLINIC ONLY)
CD4 % Helper T Cell: 15 — ABNORMAL LOW
CD4 T Cell Abs: 380 — ABNORMAL LOW

## 2011-05-27 LAB — T-HELPER CELL (CD4) - (RCID CLINIC ONLY): CD4 T Cell Abs: 290 — ABNORMAL LOW

## 2011-06-06 ENCOUNTER — Telehealth: Payer: Self-pay | Admitting: *Deleted

## 2011-06-06 NOTE — Telephone Encounter (Signed)
C/o having bronchitis for 1 month. Coughing no fever. Does not smoke. Wants to be seen. Offered her appt today. She cannot come today. Called her back about other days/times & had to leave a message. If unable to come this week, may use a local prime care/urgent care

## 2011-06-07 ENCOUNTER — Encounter: Payer: Self-pay | Admitting: Infectious Disease

## 2011-06-07 ENCOUNTER — Ambulatory Visit (INDEPENDENT_AMBULATORY_CARE_PROVIDER_SITE_OTHER): Payer: Medicaid Other | Admitting: Infectious Disease

## 2011-06-07 ENCOUNTER — Ambulatory Visit
Admission: RE | Admit: 2011-06-07 | Discharge: 2011-06-07 | Disposition: A | Discharge status: Home / Self Care | Payer: Medicaid Other | Source: Ambulatory Visit | Attending: Infectious Disease | Admitting: Infectious Disease

## 2011-06-07 VITALS — BP 159/95 | HR 84 | Temp 98.1°F | Wt 257.5 lb

## 2011-06-07 DIAGNOSIS — R05 Cough: Secondary | ICD-10-CM

## 2011-06-07 DIAGNOSIS — R0609 Other forms of dyspnea: Secondary | ICD-10-CM

## 2011-06-07 DIAGNOSIS — R0989 Other specified symptoms and signs involving the circulatory and respiratory systems: Secondary | ICD-10-CM

## 2011-06-07 DIAGNOSIS — B20 Human immunodeficiency virus [HIV] disease: Secondary | ICD-10-CM

## 2011-06-07 MED ORDER — DOXYCYCLINE HYCLATE 100 MG PO TABS: 100.0000 mg | ORAL_TABLET | Freq: Two times a day (BID) | ORAL | Status: AC

## 2011-06-07 MED ORDER — BECLOMETHASONE DIPROPIONATE 80 MCG/ACT IN AERS: 1.0000 | INHALATION_SPRAY | Freq: Two times a day (BID) | RESPIRATORY_TRACT | Status: DC

## 2011-06-07 MED ORDER — ALBUTEROL 90 MCG/ACT IN AERS: 2.0000 | INHALATION_SPRAY | Freq: Four times a day (QID) | RESPIRATORY_TRACT | Status: DC | PRN

## 2011-06-07 NOTE — Progress Notes (Signed)
  Subjective:    Patient ID: Jenna Watts, female    DOB: [birth year], 50 y.o.   MRN: [id number]  HPI  50 year old Caucasian lady with HIV and intermittent compliance with ARV, currently claiming to be taking all of her medications as prescribed, also with history of recurrent bronchitis comes today into clinic with worsening cough beyond her baseline cough with Spath sputum production and worsening dyspnea on exertion. She has been taking her albuterol MDI twice daily and is now out of this medication (not on her med list). She is without fever, nausea, malaise or sinus congestion.   Review of Systems  Constitutional: Negative for fever, chills, diaphoresis, activity change, appetite change, fatigue and unexpected weight change.  HENT: Negative for congestion, sore throat, rhinorrhea, sneezing, trouble swallowing and sinus pressure.   Eyes: Negative for photophobia and visual disturbance.  Respiratory: Positive for cough, shortness of breath and wheezing. Negative for chest tightness and stridor.   Cardiovascular: Negative for chest pain, palpitations and leg swelling.  Gastrointestinal: Negative for nausea, vomiting, abdominal pain, diarrhea, constipation, blood in stool, abdominal distention and anal bleeding.  Genitourinary: Negative for dysuria, hematuria, flank pain and difficulty urinating.  Musculoskeletal: Negative for myalgias, back pain, joint swelling, arthralgias and gait problem.  Skin: Negative for color change, pallor, rash and wound.  Neurological: Negative for dizziness, tremors, weakness and light-headedness.  Hematological: Negative for adenopathy. Does not bruise/bleed easily.  Psychiatric/Behavioral: Negative for behavioral problems, confusion, sleep disturbance, dysphoric mood, decreased concentration and agitation.       Objective:   Physical Exam  Constitutional: She is oriented to person, place, and time. She appears well-developed and well-nourished. No distress.    HENT:  Head: Normocephalic and atraumatic.  Mouth/Throat: Oropharynx is clear and moist. No oropharyngeal exudate.  Eyes: Conjunctivae and EOM are normal. Pupils are equal, round, and reactive to light. No scleral icterus.  Neck: Normal range of motion. Neck supple. No JVD present.  Cardiovascular: Normal rate, regular rhythm and normal heart sounds.  Exam reveals no gallop and no friction rub.   No murmur heard. Pulmonary/Chest: Effort normal and breath sounds normal. No respiratory distress. She has no wheezes. She has no rales. She exhibits no tenderness.  Abdominal: She exhibits no distension and no mass. There is no tenderness. There is no rebound and no guarding.  Musculoskeletal: She exhibits no edema and no tenderness.  Lymphadenopathy:    She has no cervical adenopathy.  Neurological: She is alert and oriented to person, place, and time. She has normal reflexes. She exhibits normal muscle tone. Coordination normal.  Skin: Skin is warm and dry. She is not diaphoretic. No erythema. No pallor.  Psychiatric: She has a normal mood and affect. Her behavior is normal. Judgment and thought content normal.          Assessment & Plan:

## 2011-06-07 NOTE — Assessment & Plan Note (Signed)
Recheck viral load and cd4 today 

## 2011-06-07 NOTE — Assessment & Plan Note (Signed)
As above, might benefit from formal PFTs

## 2011-06-07 NOTE — Patient Instructions (Signed)
i want to get blood work and chest xray  Please take the albuterol inhaler two puffs four times daily as needed Take the new steroid inhaler one puff twice daily And take Doxycyline 100mg  twice a day for 10 days

## 2011-06-07 NOTE — Assessment & Plan Note (Signed)
This sounds like an exacerbation of bronchitis, though she does not carry diagnosis of COPD. She is currently taking the albuterol MDI EVERy day, twice a day. I will refill this and give her a steroid inhaler (QVAR).. I will check a  Chest xray to exclude pneumonia and place her on doxycyline. I did not give her systemic prednisone at this pt. I think in future she would benefit from formal PFTs given the fact that she could have other reasons for her DOE such as obesity hypoventilation.

## 2011-06-07 NOTE — Telephone Encounter (Signed)
Has appt today at 3:30pm.

## 2011-06-08 LAB — COMPLETE METABOLIC PANEL WITH GFR
AST: 28 U/L (ref 0–37)
Albumin: 3.6 g/dL (ref 3.5–5.2)
Alkaline Phosphatase: 81 U/L (ref 39–117)
BUN: 12 mg/dL (ref 6–23)
Creat: 0.63 mg/dL (ref 0.50–1.10)
GFR, Est Non African American: >90 mL/min (ref >90)
Glucose, Bld: 85 mg/dL (ref 70–99)
Total Bilirubin: 0.3 mg/dL (ref 0.3–1.2)

## 2011-06-08 LAB — CBC WITH DIFFERENTIAL/PLATELET
Basophils Absolute: 0 10*3/uL (ref 0.0–0.1)
Basophils Relative: 0 % (ref 0–1)
Eosinophils Absolute: 0.1 10*3/uL (ref 0.0–0.7)
Eosinophils Relative: 2 % (ref 0–5)
Lymphs Abs: 2.3 10*3/uL (ref 0.7–4.0)
MCH: 22.4 pg — ABNORMAL LOW (ref 26.0–34.0)
MCHC: 29.7 g/dL — ABNORMAL LOW (ref 30.0–36.0)
MCV: 75.4 fL — ABNORMAL LOW (ref 78.0–100.0)
Neutrophils Relative %: 54 % (ref 43–77)
Platelets: 349 10*3/uL (ref 150–400)
RBC: 4.68 MIL/uL (ref 3.87–5.11)
RDW: 16.8 % — ABNORMAL HIGH (ref 11.5–15.5)

## 2011-06-09 LAB — T-HELPER CELL (CD4) - (RCID CLINIC ONLY)
CD4 % Helper T Cell: 13 % — ABNORMAL LOW (ref 33–55)
CD4 T Cell Abs: 290 uL — ABNORMAL LOW (ref 400–2700)

## 2011-06-09 LAB — HIV-1 RNA QUANT-NO REFLEX-BLD: HIV 1 RNA Quant: 22900 copies/mL — ABNORMAL HIGH (ref <20)

## 2011-07-29 ENCOUNTER — Ambulatory Visit: Payer: Medicaid Other

## 2011-08-04 ENCOUNTER — Ambulatory Visit (INDEPENDENT_AMBULATORY_CARE_PROVIDER_SITE_OTHER): Payer: Medicaid Other | Admitting: Internal Medicine

## 2011-08-04 VITALS — BP 159/98 | HR 86 | Temp 97.8°F | Ht 62.0 in | Wt 249.5 lb

## 2011-08-04 DIAGNOSIS — Z23 Encounter for immunization: Secondary | ICD-10-CM

## 2011-08-04 DIAGNOSIS — B20 Human immunodeficiency virus [HIV] disease: Secondary | ICD-10-CM

## 2011-08-04 DIAGNOSIS — J42 Unspecified chronic bronchitis: Secondary | ICD-10-CM | POA: Insufficient documentation

## 2011-08-04 DIAGNOSIS — R0989 Other specified symptoms and signs involving the circulatory and respiratory systems: Secondary | ICD-10-CM

## 2011-08-04 DIAGNOSIS — R05 Cough: Secondary | ICD-10-CM

## 2011-08-04 MED ORDER — ALBUTEROL 90 MCG/ACT IN AERS: 2.0000 | INHALATION_SPRAY | Freq: Four times a day (QID) | RESPIRATORY_TRACT | Status: DC | PRN

## 2011-08-04 MED ORDER — BECLOMETHASONE DIPROPIONATE 80 MCG/ACT IN AERS: 1.0000 | INHALATION_SPRAY | Freq: Two times a day (BID) | RESPIRATORY_TRACT | Status: DC

## 2011-08-04 NOTE — Progress Notes (Signed)
Patient ID: Jenna Watts, female   DOB: 07/18/1961, 50 y.o.   MRN: [id number]  INFECTIOUS DISEASE PROGRESS NOTE    Subjective: Jenna Watts is in for her routine visit. She has her medications with her and states she has not missed any doses but she is taking Combivir at 8 in the morning and then again at noon. She has been back to the emergency department at [facility] for her cough. She remains resistant to getting a primary care doctor near home. She states that she would rather go to the emergency department because she does not have regular transportation. However when I asked her how she gets to the emergency department she says her boyfriend takes are.  Objective:   General: She is in no distress. Skin: No rash Lungs: Clear without any wheezing or crackles. Cor: Distant but regular S1 and S2 and no murmurs Abdomen: Soft and obese   Lab Results Lab Results  Component Value Date   WBC 6.4 06/07/2011   HGB 10.5* 06/07/2011   HCT 35.3* 06/07/2011   MCV 75.4* 06/07/2011   PLT 349 06/07/2011    Lab Results  Component Value Date   CREATININE 0.63 06/07/2011   BUN 12 06/07/2011   NA 138 06/07/2011   K 4.1 06/07/2011   CL 105 06/07/2011   CO2 23 06/07/2011    Lab Results  Component Value Date   ALT 17 06/07/2011   AST 28 06/07/2011   ALKPHOS 81 06/07/2011   BILITOT 0.3 06/07/2011      HIV 1 RNA Quant (copies/mL)  Date Value  06/07/2011 22900*  03/21/2011 5030*  02/15/2011 30400*     CD4 T Cell Abs (cmm)  Date Value  06/07/2011 290*  03/21/2011 440   02/15/2011 370*       Microbiology: No results found for this or any previous visit (from the past 240 hour(s)).  Studies/Results: No results found.   Assessment: Her HIV is not suppressed on her current regimen. I remains skeptical that she has been able to adhere to it given her long-standing history of problems with adherence. I will check a viral load and see her back in 4-6 weeks.  I have  encouraged her to get a primary care doctor near her home so she can avoid cycling and out of the emergency department. She needs ongoing care for her chronic bronchitis. I have told her that I did not want her on chronic narcotic cough syrup.  She also needs primary care near her home for her hypertension.  Plan: 1. Genotype resistance test 2. Return to clinic in 4-6 weeks   Cliffton Asters, MD [facility] for Infectious Diseases [facility] [phone] pager   [phone] cell 08/04/2011, 9:56 AM

## 2011-08-17 ENCOUNTER — Ambulatory Visit: Payer: Medicaid Other

## 2011-09-20 ENCOUNTER — Encounter: Payer: Self-pay | Admitting: Internal Medicine

## 2011-09-20 ENCOUNTER — Ambulatory Visit (INDEPENDENT_AMBULATORY_CARE_PROVIDER_SITE_OTHER): Payer: Medicaid Other | Admitting: Internal Medicine

## 2011-09-20 DIAGNOSIS — B20 Human immunodeficiency virus [HIV] disease: Secondary | ICD-10-CM

## 2011-09-20 DIAGNOSIS — I10 Essential (primary) hypertension: Secondary | ICD-10-CM

## 2011-09-20 LAB — LIPID PANEL
Cholesterol: 131 mg/dL (ref 0–200)
LDL Cholesterol: 63 mg/dL (ref 0–99)
Triglycerides: 103 mg/dL (ref <150)
VLDL: 21 mg/dL (ref 0–40)

## 2011-09-20 LAB — COMPREHENSIVE METABOLIC PANEL
ALT: 15 U/L (ref 0–35)
Albumin: 3.9 g/dL (ref 3.5–5.2)
Alkaline Phosphatase: 84 U/L (ref 39–117)
CO2: 23 mEq/L (ref 19–32)
Glucose, Bld: 87 mg/dL (ref 70–99)
Potassium: 4.5 mEq/L (ref 3.5–5.3)
Sodium: 140 mEq/L (ref 135–145)
Total Bilirubin: 0.3 mg/dL (ref 0.3–1.2)
Total Protein: 7.4 g/dL (ref 6.0–8.3)

## 2011-09-20 LAB — CBC
Hemoglobin: 10.5 g/dL — ABNORMAL LOW (ref 12.0–15.0)
MCH: 22.8 pg — ABNORMAL LOW (ref 26.0–34.0)
Platelets: 369 10*3/uL (ref 150–400)
RBC: 4.6 MIL/uL (ref 3.87–5.11)

## 2011-09-20 MED ORDER — HYDROCHLOROTHIAZIDE 25 MG PO TABS: 25.0000 mg | ORAL_TABLET | Freq: Every day | ORAL | Status: DC

## 2011-09-20 NOTE — Progress Notes (Signed)
Addended by: [name] D on: 09/20/2011 10:17 AM   Modules accepted: Orders

## 2011-09-20 NOTE — Progress Notes (Signed)
Patient ID: Jenna Watts, female   DOB: 1961/02/28, 51 y.o.   MRN: [id number]  INFECTIOUS DISEASE PROGRESS NOTE    Subjective: Shamecka is in for her routine visit. Unfortunately she misunderstood and has not been taking any of her HIV medicines since her last visit. Also, she did not get her genotype resistance test after her last visit one month ago as ordered.  Objective:   General: Morbid obesity Skin: No rash Lungs: Clear Cor: Regular S1 and S2 no murmurs Abdomen: Soft obese but nontender   Lab Results Lab Results  Component Value Date   WBC 6.4 06/07/2011   HGB 10.5* 06/07/2011   HCT 35.3* 06/07/2011   MCV 75.4* 06/07/2011   PLT 349 06/07/2011    Lab Results  Component Value Date   CREATININE 0.63 06/07/2011   BUN 12 06/07/2011   NA 138 06/07/2011   K 4.1 06/07/2011   CL 105 06/07/2011   CO2 23 06/07/2011    Lab Results  Component Value Date   ALT 17 06/07/2011   AST 28 06/07/2011   ALKPHOS 81 06/07/2011   BILITOT 0.3 06/07/2011      HIV 1 RNA Quant (copies/mL)  Date Value  06/07/2011 22900*  03/21/2011 5030*  02/15/2011 30400*     CD4 T Cell Abs (cmm)  Date Value  06/07/2011 290*  03/21/2011 440   02/15/2011 370*     Assessment: Her HIV remains poorly controlled. Although she may have reverted to wild type virus I will check a genotype resistance testing today and see her back in one month  Her blood pressure is up even further today. It has been increasing over the past year. I will start her on hydrochlorothiazide and see her back in one month.  She is morbidly obese and at risk for all sorts of problems related to that but she remains reluctant to find a primary care physician closer to home and [facility].  Plan: 1. Check. Labs including genotype resistance test 2. Start hydrochlorothiazide 3. Return to clinic in one month   Cliffton Asters, MD [facility] for Infectious Diseases [facility] [phone] pager   [phone]  cell 09/20/2011, 10:08 AM

## 2011-09-21 LAB — T-HELPER CELL (CD4) - (RCID CLINIC ONLY): CD4 T Cell Abs: 330 uL — ABNORMAL LOW (ref 400–2700)

## 2011-09-22 LAB — HIV-1 RNA ULTRAQUANT REFLEX TO GENTYP+
HIV 1 RNA Quant: 12689 copies/mL — ABNORMAL HIGH (ref <20)
HIV-1 RNA Quant, Log: 4.1 {Log} — ABNORMAL HIGH (ref <1.30)

## 2011-10-20 ENCOUNTER — Ambulatory Visit: Payer: Medicaid Other | Admitting: Internal Medicine

## 2011-11-15 ENCOUNTER — Ambulatory Visit: Payer: Medicaid Other | Admitting: Internal Medicine

## 2011-11-15 ENCOUNTER — Telehealth: Payer: Self-pay | Admitting: *Deleted

## 2011-11-15 NOTE — Telephone Encounter (Signed)
Message left to call [facility] for new appt. 

## 2011-12-02 ENCOUNTER — Ambulatory Visit (INDEPENDENT_AMBULATORY_CARE_PROVIDER_SITE_OTHER): Payer: Medicaid Other | Admitting: *Deleted

## 2011-12-02 ENCOUNTER — Other Ambulatory Visit (HOSPITAL_COMMUNITY)
Admission: RE | Admit: 2011-12-02 | Discharge: 2011-12-02 | Disposition: A | Discharge status: Home / Self Care | Payer: Medicaid Other | Source: Ambulatory Visit | Attending: Infectious Diseases | Admitting: Infectious Diseases

## 2011-12-02 DIAGNOSIS — R87810 Cervical high risk human papillomavirus (HPV) DNA test positive: Secondary | ICD-10-CM | POA: Insufficient documentation

## 2011-12-02 DIAGNOSIS — R87610 Atypical squamous cells of undetermined significance on cytologic smear of cervix (ASC-US): Secondary | ICD-10-CM

## 2011-12-02 DIAGNOSIS — Z124 Encounter for screening for malignant neoplasm of cervix: Secondary | ICD-10-CM

## 2011-12-02 DIAGNOSIS — R05 Cough: Secondary | ICD-10-CM

## 2011-12-02 DIAGNOSIS — Z01419 Encounter for gynecological examination (general) (routine) without abnormal findings: Secondary | ICD-10-CM | POA: Insufficient documentation

## 2011-12-02 DIAGNOSIS — B20 Human immunodeficiency virus [HIV] disease: Secondary | ICD-10-CM

## 2011-12-02 DIAGNOSIS — R0609 Other forms of dyspnea: Secondary | ICD-10-CM

## 2011-12-02 MED ORDER — BECLOMETHASONE DIPROPIONATE 80 MCG/ACT IN AERS: 1.0000 | INHALATION_SPRAY | Freq: Two times a day (BID) | RESPIRATORY_TRACT | Status: DC

## 2011-12-02 MED ORDER — ALBUTEROL SULFATE HFA 108 (90 BASE) MCG/ACT IN AERS: 2.0000 | INHALATION_SPRAY | Freq: Four times a day (QID) | RESPIRATORY_TRACT | Status: DC | PRN

## 2011-12-02 NOTE — Patient Instructions (Signed)
  Your results will be ready in about a week.  I will mail them to you.  Thank you for coming to the Center for your care.  Chester Romero 

## 2011-12-02 NOTE — Progress Notes (Signed)
  Subjective:     Jenna Watts is a 51 y.o. woman who comes in today for a  pap smear only.  Previous abnormal Pap smears: no. Contraception:  Condoms Pt c/o of cough, nasal drainage and malaise.  Very involved in obtaining her GED and classes to rest properly.  Requested inhaler refills be sent in to pharmacy.  Pt stated that OTC claritin not working.  Pt needs a f/u appt with Dr. Orvan Falconer per the last OV note.  Pt to make appt on exit from today's OV.   Objective:    There were no vitals taken for this visit. Pt wt. 244 lb and 8 oz. Pelvic Exam:  Pap smear obtained.   Assessment:    Screening pap smear.   Plan:    Follow up in one year, or as indicated by Pap results.  Pt given educational materials re: HIV and women, diet, exercise, heart disease, PAP smears and BSE. Pt given condoms.

## 2011-12-08 ENCOUNTER — Telehealth: Payer: Self-pay | Admitting: *Deleted

## 2011-12-08 NOTE — Telephone Encounter (Signed)
Message left informing pt that she was being referred to [facility] for follow-up of abnormal PAP smear.  Pt informed that she would be contacted by someone from [facility] about the appt.

## 2011-12-08 NOTE — Progress Notes (Signed)
Addended by: [name] D on: 12/08/2011 01:50 PM   Modules accepted: Orders

## 2011-12-13 ENCOUNTER — Encounter: Payer: Self-pay | Admitting: Family Medicine

## 2011-12-14 ENCOUNTER — Other Ambulatory Visit: Payer: Self-pay | Admitting: Internal Medicine

## 2012-01-18 ENCOUNTER — Encounter: Payer: Medicaid Other | Admitting: Family Medicine

## 2012-02-07 ENCOUNTER — Telehealth: Payer: Self-pay | Admitting: *Deleted

## 2012-02-07 ENCOUNTER — Other Ambulatory Visit: Payer: Self-pay | Admitting: *Deleted

## 2012-02-07 DIAGNOSIS — B20 Human immunodeficiency virus [HIV] disease: Secondary | ICD-10-CM

## 2012-02-07 NOTE — Telephone Encounter (Signed)
States she fell on the steps of a bus on 02/01/12. Went to ED where they examined her. She was given rxs which she has not yet filled, thinks one is for pain, one for muscle spasms. Not sure about the other. C/o swelling in legs, arms & hips.  L leg is more painful than the other. States she favors that leg when walking.  Wants to be seen. She does not have a primary doctor. I told her how helpful it would be to have a primary close to her who may have more appts available for non-infectious disease issues. She will thinks about this.  To see Dr. Orvan Falconer Thursday T 3:30pm. I entered labs & told her she can get her labs when she is here

## 2012-02-09 ENCOUNTER — Ambulatory Visit: Payer: Medicaid Other | Admitting: Internal Medicine

## 2012-02-24 IMAGING — CR DG CHEST 2V
2 series · 2 of 2 positions shown · non-contrast
Comparison: None

CLINICAL DATA: Cough, asthma, HIV

CHEST - 2 VIEW

[w chest pa]
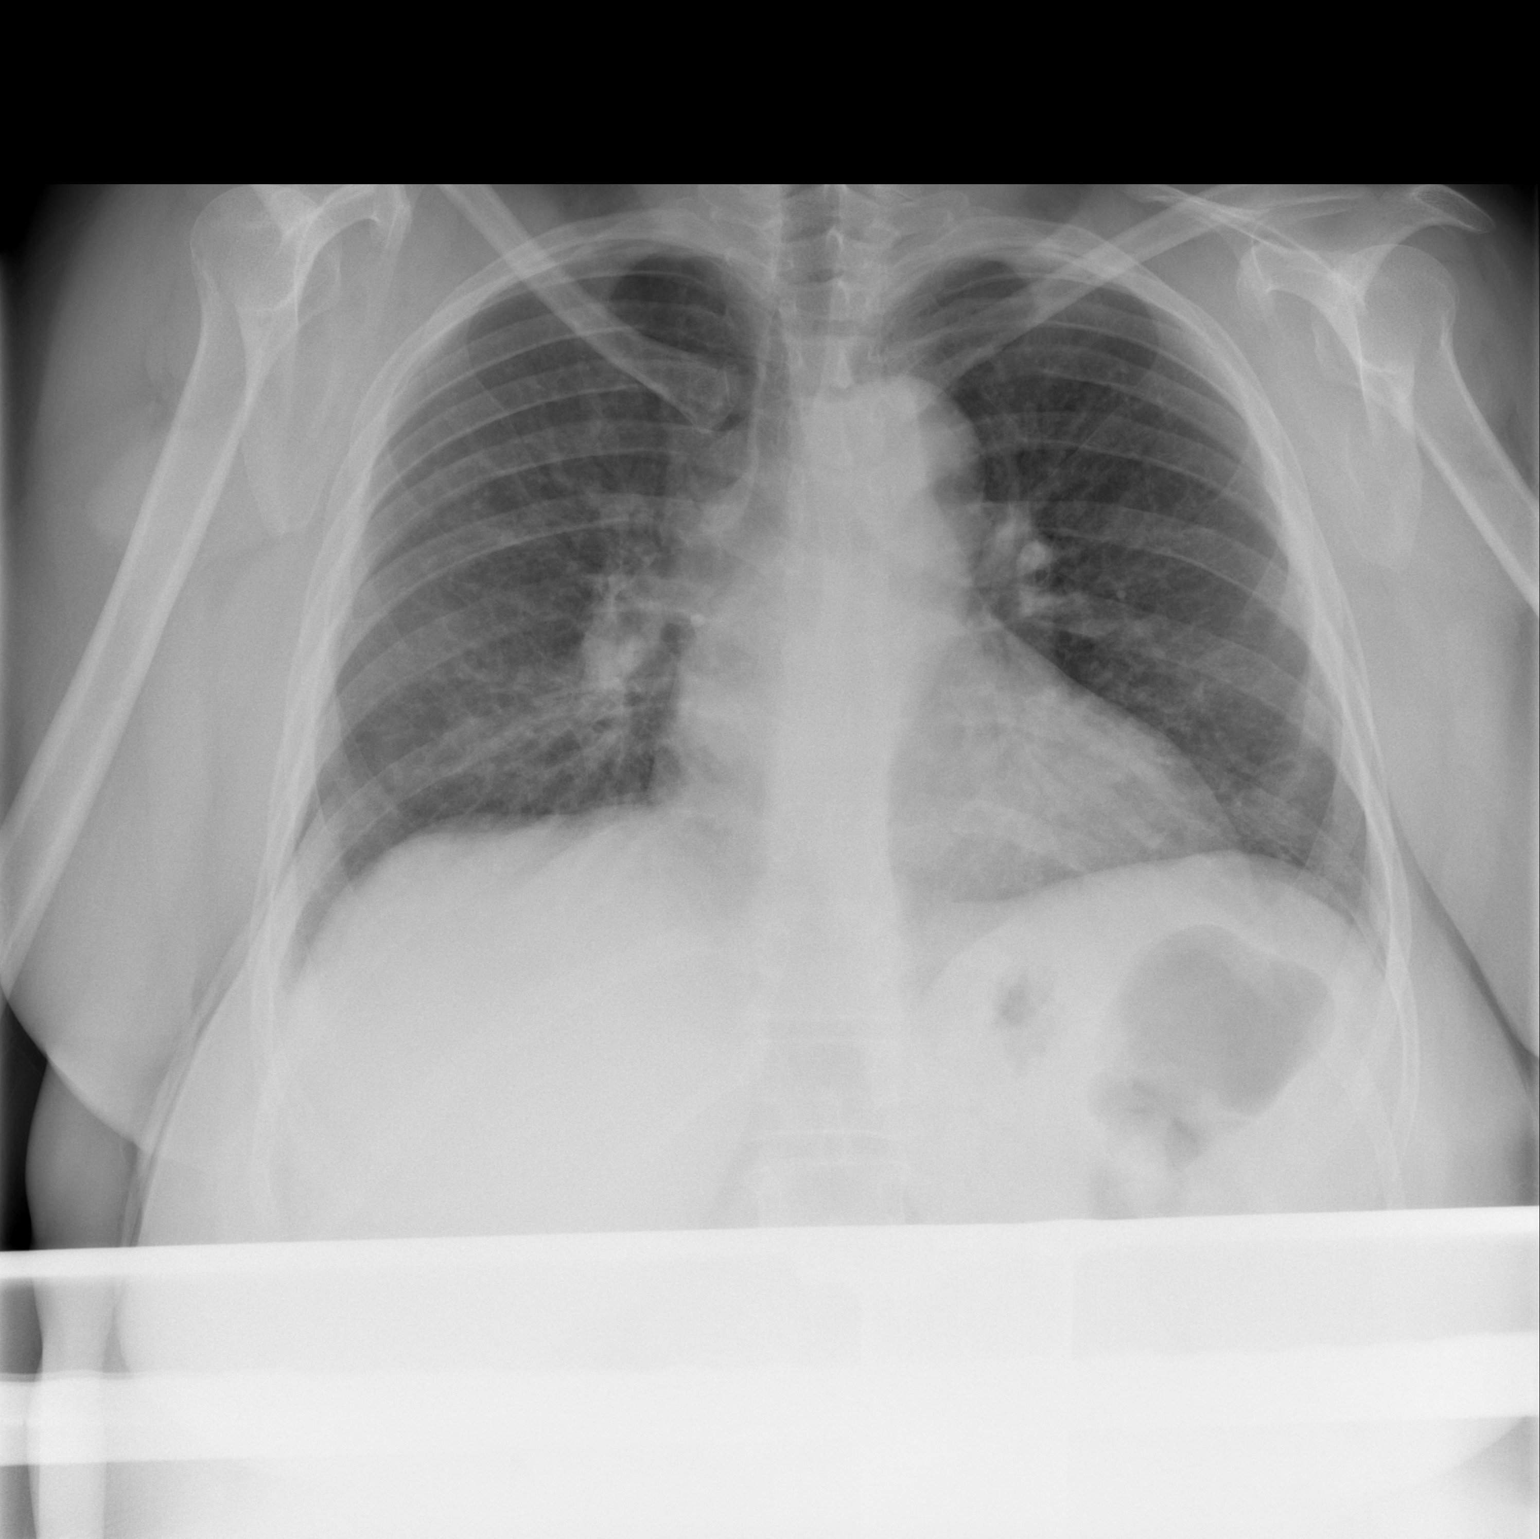

[w chest lat]
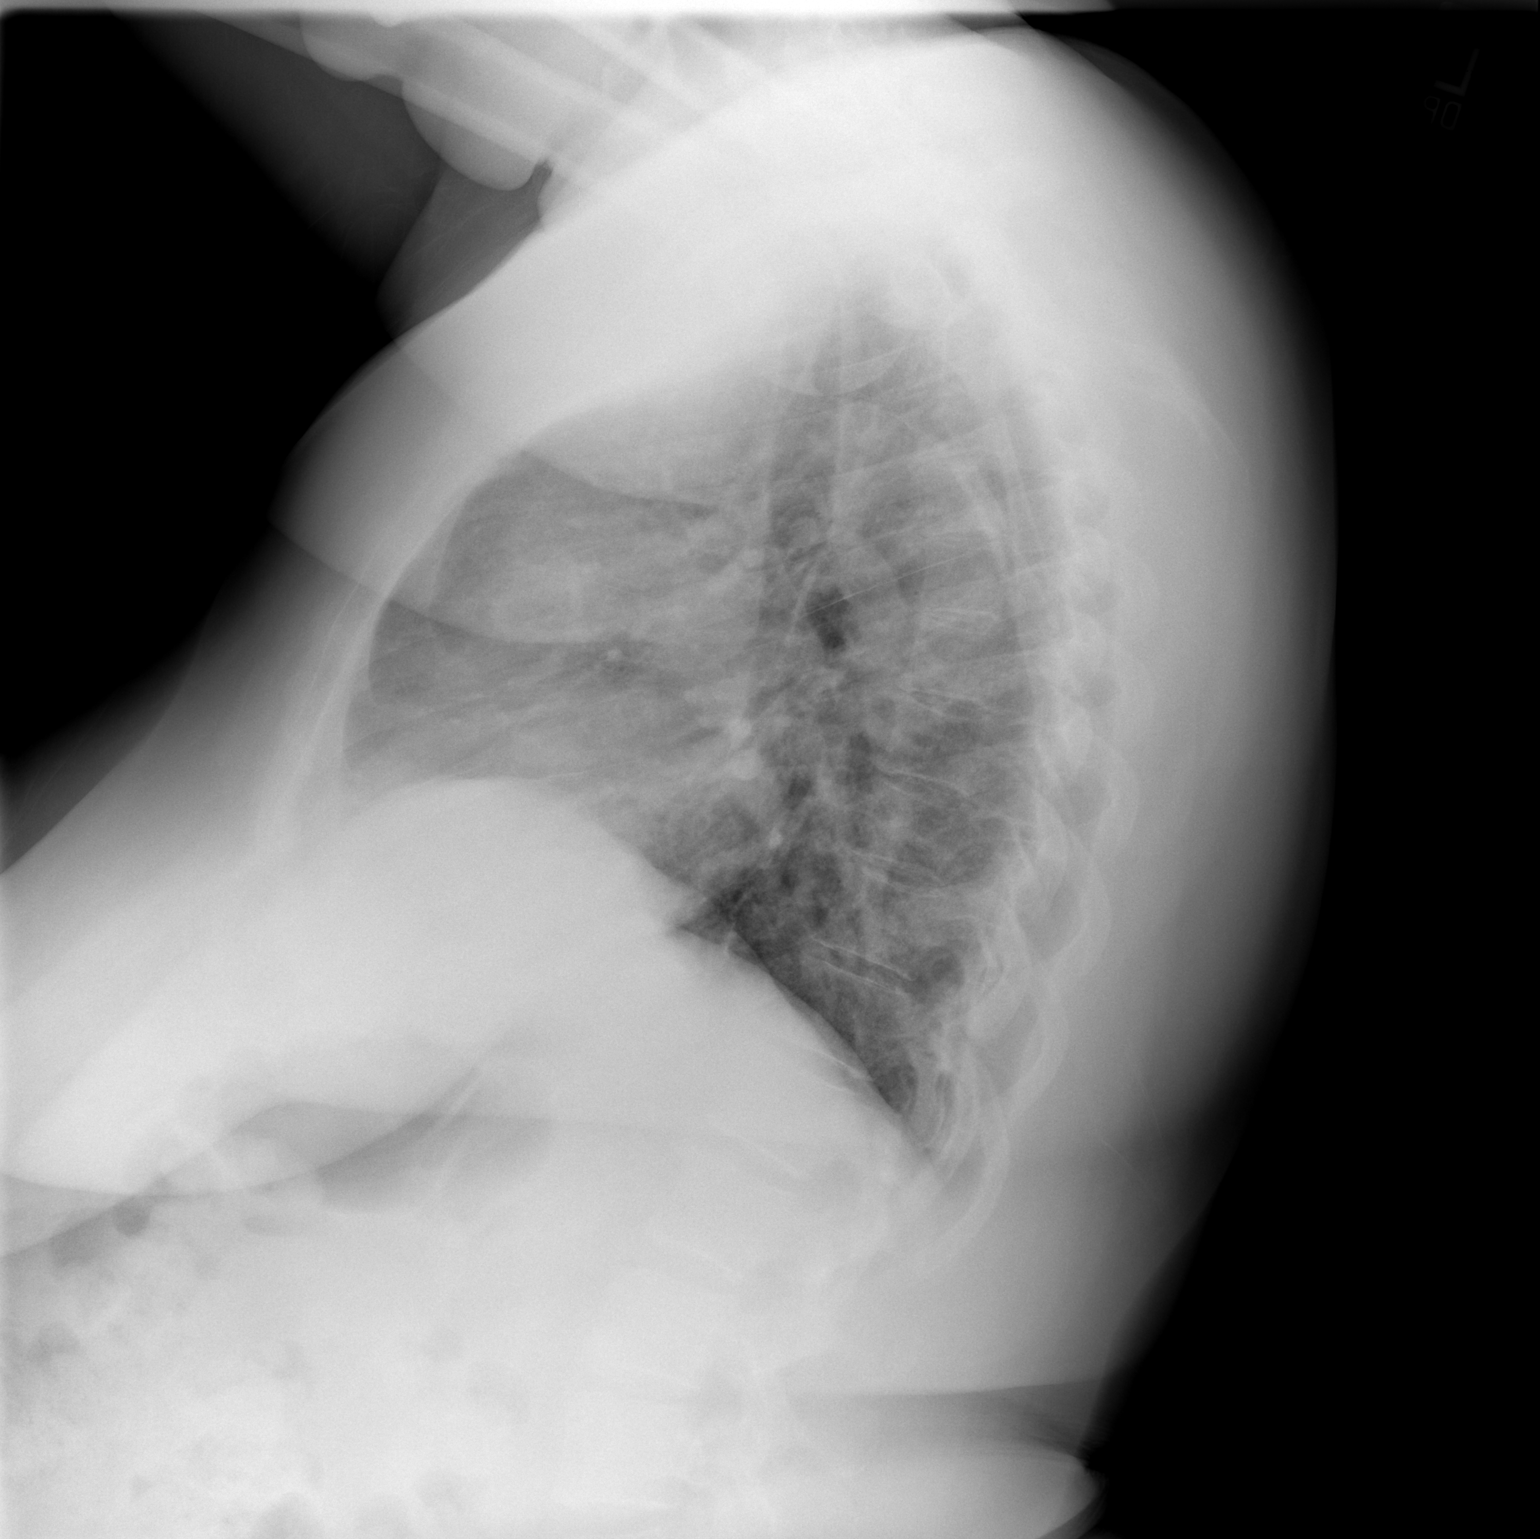

[2 of 2 positions shown; findings below may reference images not displayed]

FINDINGS: Upper-normal size of cardiac silhouette.
Tortuous aorta.
Pulmonary vascularity normal.
Peribronchial thickening question subtle right base infiltrate
versus atelectasis.
Remaining lungs clear.
No pleural effusion or pneumothorax.
No acute osseous findings.
IMPRESSION: Bronchitic changes with questionable right basilar infiltrate
versus atelectasis.

## 2012-06-27 ENCOUNTER — Telehealth: Payer: Self-pay | Admitting: *Deleted

## 2012-06-27 NOTE — Telephone Encounter (Signed)
Needs to make and keep lab and MD appts.  Pt returned call immediately and made lab and MD appts.

## 2012-07-09 ENCOUNTER — Other Ambulatory Visit (INDEPENDENT_AMBULATORY_CARE_PROVIDER_SITE_OTHER): Payer: Medicaid Other

## 2012-07-09 DIAGNOSIS — B20 Human immunodeficiency virus [HIV] disease: Secondary | ICD-10-CM

## 2012-07-09 LAB — CBC WITH DIFFERENTIAL/PLATELET
Basophils Absolute: 0 10*3/uL (ref 0.0–0.1)
Eosinophils Relative: 2 % (ref 0–5)
HCT: 34.1 % — ABNORMAL LOW (ref 36.0–46.0)
Hemoglobin: 11 g/dL — ABNORMAL LOW (ref 12.0–15.0)
Lymphocytes Relative: 40 % (ref 12–46)
Lymphs Abs: 2.6 10*3/uL (ref 0.7–4.0)
MCV: 74 fL — ABNORMAL LOW (ref 78.0–100.0)
Monocytes Absolute: 0.7 10*3/uL (ref 0.1–1.0)
Monocytes Relative: 11 % (ref 3–12)
Neutro Abs: 3 10*3/uL (ref 1.7–7.7)
RBC: 4.61 MIL/uL (ref 3.87–5.11)
WBC: 6.5 10*3/uL (ref 4.0–10.5)

## 2012-07-09 LAB — COMPLETE METABOLIC PANEL WITH GFR
AST: 30 U/L (ref 0–37)
Albumin: 3.9 g/dL (ref 3.5–5.2)
BUN: 7 mg/dL (ref 6–23)
CO2: 25 mEq/L (ref 19–32)
Calcium: 9.7 mg/dL (ref 8.4–10.5)
Chloride: 106 mEq/L (ref 96–112)
GFR, Est African American: >89 mL/min
Glucose, Bld: 93 mg/dL (ref 70–99)
Potassium: 4.5 mEq/L (ref 3.5–5.3)

## 2012-07-09 NOTE — Addendum Note (Signed)
Addended by: Mariea Clonts D on: 07/09/2012 10:35 AM   Modules accepted: Orders

## 2012-07-10 LAB — T-HELPER CELL (CD4) - (RCID CLINIC ONLY): CD4 T Cell Abs: 310 uL — ABNORMAL LOW (ref 400–2700)

## 2012-07-10 LAB — HIV-1 RNA QUANT-NO REFLEX-BLD: HIV-1 RNA Quant, Log: 4.55 {Log} — ABNORMAL HIGH (ref <1.30)

## 2012-07-19 ENCOUNTER — Other Ambulatory Visit: Payer: Self-pay | Admitting: *Deleted

## 2012-07-19 ENCOUNTER — Encounter: Payer: Self-pay | Admitting: Internal Medicine

## 2012-07-19 ENCOUNTER — Ambulatory Visit (INDEPENDENT_AMBULATORY_CARE_PROVIDER_SITE_OTHER): Payer: Medicaid Other | Admitting: Internal Medicine

## 2012-07-19 VITALS — BP 132/101 | HR 107 | Temp 97.4°F | Ht 62.0 in | Wt 238.5 lb

## 2012-07-19 DIAGNOSIS — Z23 Encounter for immunization: Secondary | ICD-10-CM

## 2012-07-19 DIAGNOSIS — B20 Human immunodeficiency virus [HIV] disease: Secondary | ICD-10-CM

## 2012-07-19 DIAGNOSIS — I10 Essential (primary) hypertension: Secondary | ICD-10-CM

## 2012-07-19 DIAGNOSIS — R05 Cough: Secondary | ICD-10-CM

## 2012-07-19 DIAGNOSIS — R0989 Other specified symptoms and signs involving the circulatory and respiratory systems: Secondary | ICD-10-CM

## 2012-07-19 MED ORDER — HYDROCHLOROTHIAZIDE 25 MG PO TABS: 25.0000 mg | ORAL_TABLET | Freq: Every day | ORAL | Status: DC

## 2012-07-19 MED ORDER — LAMIVUDINE-ZIDOVUDINE 150-300 MG PO TABS: 1.0000 | ORAL_TABLET | Freq: Two times a day (BID) | ORAL | Status: DC

## 2012-07-19 MED ORDER — RITONAVIR 100 MG PO TABS: 100.0000 mg | ORAL_TABLET | Freq: Every day | ORAL | Status: DC

## 2012-07-19 MED ORDER — ATAZANAVIR SULFATE 300 MG PO CAPS: 300.0000 mg | ORAL_CAPSULE | Freq: Every day | ORAL | Status: DC

## 2012-07-19 MED ORDER — TENOFOVIR DISOPROXIL FUMARATE 300 MG PO TABS: 300.0000 mg | ORAL_TABLET | Freq: Every day | ORAL | Status: DC

## 2012-07-19 MED ORDER — BECLOMETHASONE DIPROPIONATE 80 MCG/ACT IN AERS: 1.0000 | INHALATION_SPRAY | Freq: Two times a day (BID) | RESPIRATORY_TRACT | Status: DC

## 2012-07-19 MED ORDER — ALBUTEROL SULFATE HFA 108 (90 BASE) MCG/ACT IN AERS: 2.0000 | INHALATION_SPRAY | Freq: Four times a day (QID) | RESPIRATORY_TRACT | Status: DC | PRN

## 2012-07-19 NOTE — Progress Notes (Signed)
Patient ID: Jenna Watts, female   DOB: [birth year], 51 y.o.   MRN: [id number]     [facility] for Infectious Disease  Patient Active Problem List  Diagnosis  . HIV DISEASE  . OBESITY  . MENTAL RETARDATION  . CARPAL TUNNEL SYNDROME, BILATERAL  . HYPERTENSION  . DENTAL CARIES  . GERD  . BREAST PAIN, BILATERAL  . BOILS, RECURRENT  . FOOT PAIN, RIGHT  . DYSPNEA ON EXERTION  . CHEST PAIN, INTERMITTENT  . ASCUS PAP  . ALCOHOL ABUSE, HX OF  . TOBACCO USE, QUIT  . UPPER RESPIRATORY INFECTION, ACUTE  . Chronic bronchitis    Patient's Medications  New Prescriptions   No medications on file  Previous Medications   ALBUTEROL (PROVENTIL HFA;VENTOLIN HFA) 108 (90 BASE) MCG/ACT INHALER    Inhale 2 puffs into the lungs every 6 (six) hours as needed for wheezing.   BECLOMETHASONE (QVAR) 80 MCG/ACT INHALER    Inhale 1 puff into the lungs 2 (two) times daily.   HYDROCHLOROTHIAZIDE (HYDRODIURIL) 25 MG TABLET    Take 1 tablet (25 mg total) by mouth daily.   IBUPROFEN (ADVIL,MOTRIN) 200 MG TABLET    Take 200 mg by mouth 2 (two) times daily as needed. Take as needed for pain, must take with food    LAMIVUDINE-ZIDOVUDINE (COMBIVIR) 150-300 MG PER TABLET    TAKE 1 TABLET BY MOUTH 2 TIMES DAILY   NORVIR 100 MG TABS    TAKE 1 TABLET BY MOUTH DAILY   REYATAZ 300 MG CAPSULE    TAKE 1 CAPSULE BY MOUTH DAILY   VIREAD 300 MG TABLET    TAKE 1 TABLET BY MOUTH DAILY  Modified Medications   No medications on file  Discontinued Medications   No medications on file    Subjective: Jenna Watts is in for her routine visit. Unfortunately she states that she's been out of all of her medications since October. She states that she ran out of one of her HIV medicines first to continue to take the others until they were gone. She called her pharmacy on one occasion but states they would not answer the phone so she did not try again. She did not let Jenna Watts or anyone else no that she was out.  She did try to find a  primary care physicians near home and [facility] but was told by one clinic in [facility] that they were not accepting new patients. She has not received her influenza vaccine yet.  Objective: Temp: 97.4 F (36.3 C) (12/05 0930) Temp src: Oral (12/05 0930) BP: 132/101 mmHg (12/05 0930) Pulse Rate: 107  (12/05 0930)  General: She is in good spirits and in no distress Skin: No rash Lungs: Few scattered wheezes Cor: Regular S1 and S2 no murmurs Abdomen: Obese, soft and nontender  Lab Results HIV 1 RNA Quant (copies/mL)  Date Value  07/09/2012 35644*  09/20/2011 12689*  06/07/2011 22900*     CD4 T Cell Abs (cmm)  Date Value  07/09/2012 310*  09/20/2011 330*  06/07/2011 290*     Assessment: Not surprisingly, her HIV infection is poorly controlled given that she has been off of her medications. She continues to struggle with poor understanding of how to take her medications and when to call for help. She is using a pill box when she has her medications. We will check with her pharmacy and get her back on her medications as soon as possible. I will have her followup in 4 weeks. I've  asked her to bring in all of her medications and her pillbox at that time.  She continues to struggle with chronic asthmatic bronchitis. I will get her back on her inhaler.  Her blood pressure is poorly controlled off of medication.  Plan: 1. Restart all medications 2. Influenza vaccination 3. Followup in 4 weeks   Cliffton Asters, MD [facility] for Infectious Disease [facility] [phone] pager   [phone] cell 07/19/2012, 9:47 AM

## 2012-07-23 ENCOUNTER — Telehealth: Payer: Self-pay | Admitting: *Deleted

## 2012-07-23 NOTE — Telephone Encounter (Signed)
Call from pharmacist at [facility], she wanted to let the doctor know that they have not filled any of her HIV meds since May and she said she has not had it filled anywhere else either. Per last office note she had stated she had been off her meds since October. The pharmacist was concerned about resistance. Wendall Mola CMA

## 2012-08-21 ENCOUNTER — Telehealth: Payer: Self-pay | Admitting: *Deleted

## 2012-08-21 ENCOUNTER — Ambulatory Visit: Payer: Medicaid Other | Admitting: Internal Medicine

## 2012-08-21 NOTE — Telephone Encounter (Signed)
Pt needs new appt.  New appt scheduled for 09/11/12.

## 2012-09-11 ENCOUNTER — Ambulatory Visit: Payer: Medicaid Other | Admitting: Internal Medicine

## 2012-09-25 ENCOUNTER — Ambulatory Visit (INDEPENDENT_AMBULATORY_CARE_PROVIDER_SITE_OTHER): Payer: Medicaid Other | Admitting: Internal Medicine

## 2012-09-25 VITALS — BP 160/109 | HR 80 | Temp 98.4°F | Ht 62.0 in | Wt 237.0 lb

## 2012-09-25 DIAGNOSIS — B20 Human immunodeficiency virus [HIV] disease: Secondary | ICD-10-CM

## 2012-09-25 LAB — CBC
HCT: 35.8 % — ABNORMAL LOW (ref 36.0–46.0)
MCHC: 32.1 g/dL (ref 30.0–36.0)
Platelets: 330 10*3/uL (ref 150–400)
RDW: 18.2 % — ABNORMAL HIGH (ref 11.5–15.5)
WBC: 5.1 10*3/uL (ref 4.0–10.5)

## 2012-09-25 MED ORDER — ELVITEG-COBIC-EMTRICIT-TENOFDF 150-150-200-300 MG PO TABS: 1.0000 | ORAL_TABLET | Freq: Every day | ORAL | Status: DC

## 2012-09-25 NOTE — Progress Notes (Signed)
Patient ID: Jenna Watts, female   DOB: [birth year], 52 y.o.   MRN: [id number]          [facility] for Infectious Disease  Patient Active Problem List  Diagnosis  . HIV DISEASE  . OBESITY  . MENTAL RETARDATION  . CARPAL TUNNEL SYNDROME, BILATERAL  . HYPERTENSION  . DENTAL CARIES  . GERD  . BREAST PAIN, BILATERAL  . BOILS, RECURRENT  . FOOT PAIN, RIGHT  . DYSPNEA ON EXERTION  . CHEST PAIN, INTERMITTENT  . ASCUS PAP  . ALCOHOL ABUSE, HX OF  . TOBACCO USE, QUIT  . UPPER RESPIRATORY INFECTION, ACUTE  . Chronic bronchitis    Patient's Medications  New Prescriptions   No medications on file  Previous Medications   ALBUTEROL (PROVENTIL HFA;VENTOLIN HFA) 108 (90 BASE) MCG/ACT INHALER    Inhale 2 puffs into the lungs every 6 (six) hours as needed for wheezing.   ATAZANAVIR (REYATAZ) 300 MG CAPSULE    Take 1 capsule (300 mg total) by mouth daily with breakfast.   BECLOMETHASONE (QVAR) 80 MCG/ACT INHALER    Inhale 1 puff into the lungs 2 (two) times daily.   HYDROCHLOROTHIAZIDE (HYDRODIURIL) 25 MG TABLET    Take 1 tablet (25 mg total) by mouth daily.   IBUPROFEN (ADVIL,MOTRIN) 200 MG TABLET    Take 200 mg by mouth 2 (two) times daily as needed. Take as needed for pain, must take with food    LAMIVUDINE-ZIDOVUDINE (COMBIVIR) 150-300 MG PER TABLET    Take 1 tablet by mouth 2 (two) times daily.   RITONAVIR (NORVIR) 100 MG TABS    Take 1 tablet (100 mg total) by mouth daily with breakfast.   TENOFOVIR (VIREAD) 300 MG TABLET    Take 1 tablet (300 mg total) by mouth daily.  Modified Medications   No medications on file  Discontinued Medications   No medications on file    Subjective: Jenna Watts is in for her routine followup visit. She has 4 of her 5 medication bottles with her. She is missing her Viread When asked why she doesn't have it she says it keep it in another place. She is not using her pillbox. I went over her medications on the chart and it appears that she takes each of  her for antiretroviral medications at different times each day. When I asked her if that causes her to miss doses she says yes. When I asked her if there is any danger and missing doses she said no.  Objective: Temp: 98.4 F (36.9 C) (02/11 0920) Temp src: Oral (02/11 0920) BP: 160/109 mmHg (02/11 0920) Pulse Rate: 80 (02/11 0920)  General: her weight blood pressure remained elevated. She is in no distress Skin: no rash Lungs: clear Cor: regular S1 and S2 with no murmurs Abdomen: soft and nontender  Lab Results HIV 1 RNA Quant (copies/mL)  Date Value  07/09/2012 35644*  09/20/2011 12689*  06/07/2011 22900*     CD4 T Cell Abs (cmm)  Date Value  07/09/2012 310*  09/20/2011 330*  06/07/2011 290*     Assessment: Despite over a decade of adherence education, Jenna Watts continues to struggle mildly to take her medications correctly inconsistently. We will provide her another pillbox and she will meet with our pharmacist, Ulyses Southward, for further her adherence education counseling. I will check lab work today and see her back in 3 weeks.  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 I will simplify her regimen to Stribild and encourage her to take it once daily with her hydrochlorothiazide   Plan: 1. Adherence counseling; change to Stribild once daily 2. Check labs today 3. Followup in 1 week to check on her pillbox   Cliffton Asters, MD [facility] for Infectious Disease [facility] [phone] pager   [phone] cell 09/25/2012, 9:34 AM

## 2012-09-26 LAB — COMPREHENSIVE METABOLIC PANEL
ALT: 21 U/L (ref 0–35)
AST: 38 U/L — ABNORMAL HIGH (ref 0–37)
Albumin: 3.6 g/dL (ref 3.5–5.2)
Alkaline Phosphatase: 105 U/L (ref 39–117)
BUN: 8 mg/dL (ref 6–23)
Calcium: 9.3 mg/dL (ref 8.4–10.5)
Chloride: 104 mEq/L (ref 96–112)
Potassium: 3.8 mEq/L (ref 3.5–5.3)
Sodium: 138 mEq/L (ref 135–145)
Total Protein: 7.9 g/dL (ref 6.0–8.3)

## 2012-09-26 LAB — RPR

## 2012-09-26 LAB — LIPID PANEL: LDL Cholesterol: 45 mg/dL (ref 0–99)

## 2012-09-26 LAB — T-HELPER CELL (CD4) - (RCID CLINIC ONLY): CD4 T Cell Abs: 250 uL — ABNORMAL LOW (ref 400–2700)

## 2012-09-28 LAB — HIV-1 RNA QUANT-NO REFLEX-BLD
HIV 1 RNA Quant: 17778 copies/mL — ABNORMAL HIGH (ref <20)
HIV-1 RNA Quant, Log: 4.25 {Log} — ABNORMAL HIGH (ref <1.30)

## 2012-10-01 NOTE — Progress Notes (Signed)
HPI: Lataya Varnell is a 52 y.o. female with HIV who is here for follow up.   Allergies: Allergies  Allergen Reactions  . Blue Dyes (Parenteral)   . Penicillins   . Sulfonamide Derivatives     Vitals:    Past Medical History: No past medical history on file.  Social History: History   Social History  . Marital Status: Widowed    Spouse Name: N/A    Number of Children: N/A  . Years of Education: N/A   Social History Main Topics  . Smoking status: Never Smoker   . Smokeless tobacco: Never Used  . Alcohol Use: No  . Drug Use: No  . Sexually Active: Not Currently     Comment: given condoms   Other Topics Concern  . Not on file   Social History Narrative  . No narrative on file    Home Medications:  (Not in a hospital admission)  Current Regimen: ATV/r + Combivir + TDF  Labs: HIV 1 RNA Quant (copies/mL)  Date Value  09/25/2012 17778*  07/09/2012 35644*  09/20/2011 16109*     CD4 T Cell Abs (cmm)  Date Value  09/25/2012 250*  07/09/2012 310*  09/20/2011 330*     Hep B S Ab (no units)  Date Value  10/09/2006 No      Hepatitis B Surface Ag (no units)  Date Value  10/09/2006 No      HCV Ab (no units)  Date Value  10/09/2006 No     CrCl: Estimated Creatinine Clearance: 98.2 ml/min (by C-G formula based on Cr of 0.61).  Lipids:    Component Value Date/Time   CHOL 122 09/25/2012 1018   TRIG 116 09/25/2012 1018   HDL 54 09/25/2012 1018   CHOLHDL 2.3 09/25/2012 1018   VLDL 23 09/25/2012 1018   LDLCALC 45 09/25/2012 1018    Assessment: 52 yo with hx of HIV who has been noncompliance with her meds. Apparently, she ran out of meds and didn't notify anyone about it. She claims to take her meds but based on her interview, I really doubt it. I spend a good amount of time with her trying to convince her to take the meds correctly. I asked her if she would take her meds if I could simplified it to one pill a day. She said she would. I discussed it with Dr.  Orvan Falconer to see if we could change to Stribild. We decided that Stribild should work in her case. I also counseled her on her HTN meds and filled out her pillbox.   Recommendations: Change ARV to Stribild 1 tab qday I will call her in 1 week to check on her progress  Clide Cliff, PharmD Clinical Infectious Disease Pharmacist [facility] for [facility] Disease 10/01/2012, 10:39 PM

## 2012-10-17 ENCOUNTER — Ambulatory Visit (INDEPENDENT_AMBULATORY_CARE_PROVIDER_SITE_OTHER): Payer: Medicaid Other | Admitting: Internal Medicine

## 2012-10-17 ENCOUNTER — Encounter: Payer: Self-pay | Admitting: Internal Medicine

## 2012-10-17 VITALS — BP 146/64 | HR 99 | Temp 98.1°F | Ht 62.0 in | Wt 233.2 lb

## 2012-10-17 DIAGNOSIS — B20 Human immunodeficiency virus [HIV] disease: Secondary | ICD-10-CM

## 2012-10-17 NOTE — Progress Notes (Signed)
Patient ID: Jenna Watts, female   DOB: Feb 23, 1961, 52 y.o.   MRN: [id number]          [facility] for Infectious Disease  Patient Active Problem List  Diagnosis  . HIV DISEASE  . OBESITY  . MENTAL RETARDATION  . CARPAL TUNNEL SYNDROME, BILATERAL  . HYPERTENSION  . DENTAL CARIES  . GERD  . BREAST PAIN, BILATERAL  . BOILS, RECURRENT  . FOOT PAIN, RIGHT  . DYSPNEA ON EXERTION  . CHEST PAIN, INTERMITTENT  . ASCUS PAP  . ALCOHOL ABUSE, HX OF  . TOBACCO USE, QUIT  . UPPER RESPIRATORY INFECTION, ACUTE  . Chronic bronchitis    Patient's Medications  New Prescriptions   No medications on file  Previous Medications   ALBUTEROL (PROVENTIL HFA;VENTOLIN HFA) 108 (90 BASE) MCG/ACT INHALER    Inhale 2 puffs into the lungs every 6 (six) hours as needed for wheezing.   BECLOMETHASONE (QVAR) 80 MCG/ACT INHALER    Inhale 1 puff into the lungs 2 (two) times daily.   ELVITEGRAVIR-COBICISTAT-EMTRICITABINE-TENOFOVIR (STRIBILD) 150-150-200-300 MG TABS    Take 1 tablet by mouth daily with breakfast.   HYDROCHLOROTHIAZIDE (HYDRODIURIL) 25 MG TABLET    Take 1 tablet (25 mg total) by mouth daily.  Modified Medications   No medications on file  Discontinued Medications   No medications on file    Subjective: Jenna Watts is in for her routine followup visit. She returns with the 3 pillboxes that she was given at the time of her last visit on February 11. I believe the pillboxes had been filled with her hydrochlorothiazide. However, she has hydrochlorothiazide in about half of the slots, sometimes in both a.m. And p.m. Slot on the same day. She states that she is taking all of her medications. She seems to indicate that she may have tried to fill the pillboxes back up after they were empty. She also brings in her first bottle of Stribild and has not started taking it yet.  Objective: Temp: 98.1 F (36.7 C) (03/05 1439) Temp src: Oral (03/05 1439) BP: 146/64 mmHg (03/05 1439) Pulse Rate: 99  (03/05 1439)  General: she is in good spirits Skin: no rash Lungs: clear Cor: regular S1 and S2 no murmurs  Lab Results HIV 1 RNA Quant (copies/mL)  Date Value  09/25/2012 17778*  07/09/2012 35644*  09/20/2011 14782*     CD4 T Cell Abs (cmm)  Date Value  09/25/2012 250*  07/09/2012 310*  09/20/2011 330*     Assessment: Still extremely worried about her ability to self administer her medications. He is completely unclear to me whether she has taken her hydrochlorothiazide correctly since her last visit. We have filled out her 3 week supply with Stribild. I will have her come back in 19 days for recheck with myself and her pharmacist.  Plan: 1. Start Stribild 2. Continue hydrochlorothiazide 3. Followup on March 25   Cliffton Asters, MD [facility] for Infectious Disease [facility] [phone] pager   [phone] cell 10/17/2012, 3:28 PM

## 2012-11-06 ENCOUNTER — Encounter: Payer: Self-pay | Admitting: Internal Medicine

## 2012-11-06 ENCOUNTER — Ambulatory Visit (INDEPENDENT_AMBULATORY_CARE_PROVIDER_SITE_OTHER): Payer: Medicaid Other | Admitting: Internal Medicine

## 2012-11-06 VITALS — BP 152/93 | HR 95 | Temp 98.3°F | Ht 62.0 in | Wt 228.5 lb

## 2012-11-06 DIAGNOSIS — I10 Essential (primary) hypertension: Secondary | ICD-10-CM

## 2012-11-06 DIAGNOSIS — B20 Human immunodeficiency virus [HIV] disease: Secondary | ICD-10-CM

## 2012-11-06 NOTE — Progress Notes (Signed)
Patient ID: Jenna Watts, female   DOB: May 02, 1961, 52 y.o.   MRN: [id number]          [facility] for Infectious Disease  Patient Active Problem List  Diagnosis  . HIV DISEASE  . OBESITY  . MENTAL RETARDATION  . CARPAL TUNNEL SYNDROME, BILATERAL  . HYPERTENSION  . DENTAL CARIES  . GERD  . BREAST PAIN, BILATERAL  . BOILS, RECURRENT  . FOOT PAIN, RIGHT  . DYSPNEA ON EXERTION  . CHEST PAIN, INTERMITTENT  . ASCUS PAP  . ALCOHOL ABUSE, HX OF  . TOBACCO USE, QUIT  . UPPER RESPIRATORY INFECTION, ACUTE  . Chronic bronchitis    Patient's Medications  New Prescriptions   No medications on file  Previous Medications   ALBUTEROL (PROVENTIL HFA;VENTOLIN HFA) 108 (90 BASE) MCG/ACT INHALER    Inhale 2 puffs into the lungs every 6 (six) hours as needed for wheezing.   BECLOMETHASONE (QVAR) 80 MCG/ACT INHALER    Inhale 1 puff into the lungs 2 (two) times daily.   ELVITEGRAVIR-COBICISTAT-EMTRICITABINE-TENOFOVIR (STRIBILD) 150-150-200-300 MG TABS    Take 1 tablet by mouth daily with breakfast.   HYDROCHLOROTHIAZIDE (HYDRODIURIL) 25 MG TABLET    Take 1 tablet (25 mg total) by mouth daily.  Modified Medications   No medications on file  Discontinued Medications   No medications on file    Subjective: Jenna Watts is in for her routine followup visit. She started Stribild 3 weeks ago and has had no problems tolerating it. She has all ofher pillboxes with her today. It appears that she has been taking her to medications correctly. She has been working hard to lose weight.  Objective: Temp: 98.3 F (36.8 C) (03/25 0911) Temp src: Oral (03/25 0911) BP: 152/93 mmHg (03/25 0911) Pulse Rate: 95 (03/25 0911)  General: she's lost 22 pounds over the past year to 228. Skin: no rash Lungs: clear Cor: regular S1 and S2 no murmurs Abdomen: obese, soft and nontender  Lab Results HIV 1 RNA Quant (copies/mL)  Date Value  09/25/2012 17778*  07/09/2012 35644*  09/20/2011 12689*     CD4 T  Cell Abs (cmm)  Date Value  09/25/2012 250*  07/09/2012 310*  09/20/2011 330*     BMET    Component Value Date/Time   NA 138 09/25/2012 1018   K 3.8 09/25/2012 1018   CL 104 09/25/2012 1018   CO2 27 09/25/2012 1018   GLUCOSE 93 09/25/2012 1018   BUN 8 09/25/2012 1018   CREATININE 0.61 09/25/2012 1018   CREATININE 0.66 08/24/2010 2027   CALCIUM 9.3 09/25/2012 1018   GFRNONAA >60 11/24/2008 2112   GFRAA >60 11/24/2008 2112   Lab Results  Component Value Date   CHOL 122 09/25/2012   HDL 54 09/25/2012   LDLCALC 45 09/25/2012   TRIG 116 09/25/2012   CHOLHDL 2.3 09/25/2012   Lab Results  Component Value Date   WBC 5.1 09/25/2012   HGB 11.5* 09/25/2012   HCT 35.8* 09/25/2012   MCV 74.7* 09/25/2012   PLT 330 09/25/2012   Assessment: She seems to be doing well with her new, simplified salvage regimen. I will have her return in one month to recheck her pill boxes and obtain repeat lab work. I encouraged her to continue on lifestyle modification to lose weight. She may need changes in her antihypertensive regimen but I would like to continue hydrochlorothiazide for now so as not to give her anything else new to focus on while she is trying to  get into a routine with her new HIV regimen.  Plan: 1. Continue hydrochlorothiazide and Stribild 2. followup in one month for repeat blood work   Cliffton Asters, MD [facility] for Infectious Disease [facility] [phone] pager   [phone] cell 11/06/2012, 9:47 AM

## 2012-11-06 NOTE — Progress Notes (Signed)
HPI: Jenna Watts is a 52 y.o. female with HIV who is here to f/u on her new meds.   Allergies: Allergies  Allergen Reactions  . Blue Dyes (Parenteral)   . Penicillins   . Sulfonamide Derivatives     Vitals: Temp: 98.3 F (36.8 C) (03/25 0911) Temp src: Oral (03/25 0911) BP: 152/93 mmHg (03/25 0911) Pulse Rate: 95 (03/25 0911)  Past Medical History: No past medical history on file.  Social History: History   Social History  . Marital Status: Widowed    Spouse Name: N/A    Number of Children: N/A  . Years of Education: N/A   Social History Main Topics  . Smoking status: Never Smoker   . Smokeless tobacco: Never Used  . Alcohol Use: No  . Drug Use: No  . Sexually Active: Not Currently     Comment: given condoms   Other Topics Concern  . None   Social History Narrative  . None    Previous Regimen: ATV/r + combivir + TDF  Current Regimen: Stribild 1 tab qday  Labs: HIV 1 RNA Quant (copies/mL)  Date Value  09/25/2012 17778*  07/09/2012 35644*  09/20/2011 16109*     CD4 T Cell Abs (cmm)  Date Value  09/25/2012 250*  07/09/2012 310*  09/20/2011 330*     Hep B S Ab (no units)  Date Value  10/09/2006 No      Hepatitis B Surface Ag (no units)  Date Value  10/09/2006 No      HCV Ab (no units)  Date Value  10/09/2006 No     CrCl: Estimated Creatinine Clearance: 95 ml/min (by C-G formula based on Cr of 0.61).  Lipids:    Component Value Date/Time   CHOL 122 09/25/2012 1018   TRIG 116 09/25/2012 1018   HDL 54 09/25/2012 1018   CHOLHDL 2.3 09/25/2012 1018   VLDL 23 09/25/2012 1018   LDLCALC 45 09/25/2012 1018    Assessment: Pt has had a hx of noncompliance. She does have some issue of understanding what to take. She was on a complicated regimen and was having a hard time with it. We decided to change her to Stribild to make it easier for her. Her pillbox was filled a couple of weeks ago when she was here. I count her pills today and I think she is  taking it correctly. She didn't have her new meds with her today for me to refill her pillbox. I stressed the importance of compliance with her again and advised her to fill her pillbox when she gets home.   Recommendations: Cont Stribild 1 tab day Give her an additional pillbox so she can fill up for one month Return in 4 weeks  Clide Cliff, PharmD Clinical Infectious Disease Pharmacist [facility] for Infectious Disease 11/06/2012, 2:58 PM

## 2012-11-22 ENCOUNTER — Telehealth: Payer: Self-pay | Admitting: *Deleted

## 2012-11-22 NOTE — Telephone Encounter (Signed)
Left message requesting pt call to schedule annual PAP smear.

## 2012-12-04 ENCOUNTER — Ambulatory Visit (INDEPENDENT_AMBULATORY_CARE_PROVIDER_SITE_OTHER): Payer: Medicaid Other | Admitting: Internal Medicine

## 2012-12-04 DIAGNOSIS — Z0000 Encounter for general adult medical examination without abnormal findings: Secondary | ICD-10-CM

## 2012-12-04 DIAGNOSIS — B20 Human immunodeficiency virus [HIV] disease: Secondary | ICD-10-CM

## 2012-12-04 LAB — CBC
HCT: 32.7 % — ABNORMAL LOW (ref 36.0–46.0)
MCV: 73 fL — ABNORMAL LOW (ref 78.0–100.0)
Platelets: 357 10*3/uL (ref 150–400)
RBC: 4.48 MIL/uL (ref 3.87–5.11)
WBC: 5.2 10*3/uL (ref 4.0–10.5)

## 2012-12-04 LAB — COMPREHENSIVE METABOLIC PANEL
ALT: 9 U/L (ref 0–35)
CO2: 24 mEq/L (ref 19–32)
Creat: 0.67 mg/dL (ref 0.50–1.10)
Glucose, Bld: 98 mg/dL (ref 70–99)
Total Bilirubin: 0.2 mg/dL — ABNORMAL LOW (ref 0.3–1.2)

## 2012-12-04 NOTE — Progress Notes (Signed)
Patient ID: Jenna Watts, female   DOB: [birth year], 52 y.o.   MRN: [id number]          [facility] for Infectious Disease  Patient Active Problem List  Diagnosis  . HIV DISEASE  . OBESITY  . MENTAL RETARDATION  . CARPAL TUNNEL SYNDROME, BILATERAL  . HYPERTENSION  . DENTAL CARIES  . GERD  . BREAST PAIN, BILATERAL  . BOILS, RECURRENT  . FOOT PAIN, RIGHT  . DYSPNEA ON EXERTION  . CHEST PAIN, INTERMITTENT  . ASCUS PAP  . ALCOHOL ABUSE, HX OF  . TOBACCO USE, QUIT  . UPPER RESPIRATORY INFECTION, ACUTE  . Chronic bronchitis    Patient's Medications  New Prescriptions   No medications on file  Previous Medications   ALBUTEROL (PROVENTIL HFA;VENTOLIN HFA) 108 (90 BASE) MCG/ACT INHALER    Inhale 2 puffs into the lungs every 6 (six) hours as needed for wheezing.   BECLOMETHASONE (QVAR) 80 MCG/ACT INHALER    Inhale 1 puff into the lungs 2 (two) times daily.   ELVITEGRAVIR-COBICISTAT-EMTRICITABINE-TENOFOVIR (STRIBILD) 150-150-200-300 MG TABS    Take 1 tablet by mouth daily with breakfast.   HYDROCHLOROTHIAZIDE (HYDRODIURIL) 25 MG TABLET    Take 1 tablet (25 mg total) by mouth daily.  Modified Medications   No medications on file  Discontinued Medications   No medications on file    Subjective: Divine is in for her routine visit. She has her for pillboxes with her as well as her medications. She has 2 separate bottles of Stribild and 3 bottles of hydrochlorothiazide. She says that she has not missed any doses of her medication but I note that one Stribild is still in her pillbox from this past Saturday. She agrees that she must have missed a dose.  She says that she is having some intermittent pain in her left leg and is noted some swelling. She went to [facility] emergency department last month and was given a prescription of Toradol which she's completed. His also taken some Motrin recently but states that that doesn't help. She is now agreeable to a  referral to establish primary care.  Objective:  BP:   General: She is in good spirits Skin: No rash Oral: Clear Lungs: Clear Cor: Regular S1 and S2 with no murmurs Abdomen: Obese, soft and nontender Extremities: She has 1 subtle area of soft tissue swelling over her left lateral shin. It is not warm, discolored or tender to the touch. She has no edema.  Lab Results HIV 1 RNA Quant (copies/mL)  Date Value  09/25/2012 17778*  07/09/2012 35644*  09/20/2011 12689*     CD4 T Cell Abs (cmm)  Date Value  09/25/2012 250*  07/09/2012 310*  09/20/2011 330*     BMET    Component Value Date/Time   NA 138 09/25/2012 1018   K 3.8 09/25/2012 1018   CL 104 09/25/2012 1018   CO2 27 09/25/2012 1018   GLUCOSE 93 09/25/2012 1018   BUN 8 09/25/2012 1018   CREATININE 0.61 09/25/2012 1018   CREATININE 0.66 08/24/2010 2027   CALCIUM 9.3 09/25/2012 1018   GFRNONAA >60 11/24/2008 2112   GFRAA >60 11/24/2008 2112   Lab Results  Component Value Date   WBC 5.1 09/25/2012   HGB 11.5* 09/25/2012   HCT 35.8* 09/25/2012   MCV 74.7* 09/25/2012   PLT 330 09/25/2012   Lab Results  Component Value Date   ALT 21 09/25/2012   AST 38* 09/25/2012   ALKPHOS  105 09/25/2012   BILITOT 0.6 09/25/2012   Lab Results  Component Value Date   CHOL 122 09/25/2012   HDL 54 09/25/2012   LDLCALC 45 09/25/2012   TRIG 116 09/25/2012   CHOLHDL 2.3 09/25/2012   Assessment: It does appear that her adherence has improved over the past few months with increased counseling and use of her pillboxes. I will repeat her CD4 count and viral load today and continue Stribild for now.  I do not see any obvious cause for her leg pain. I will make a referral to [facility] to establish primary care.    Plan: 1. Continue Stribild for now 2. She received adherence counseling from me and our pharmacist, [facility], again today and have her pillboxes refilled 3. Check CD4 count and viral load 4. Referral to establish primary  care   Cliffton Asters, MD [facility] for Infectious Disease [facility] [phone] pager   [phone] cell 12/04/2012, 9:35 AM

## 2012-12-04 NOTE — Addendum Note (Signed)
Addended by: [name] D on: 12/04/2012 11:59 AM   Modules accepted: Orders

## 2012-12-05 LAB — T-HELPER CELL (CD4) - (RCID CLINIC ONLY)
CD4 % Helper T Cell: 15 % — ABNORMAL LOW (ref 33–55)
CD4 T Cell Abs: 320 uL — ABNORMAL LOW (ref 400–2700)

## 2012-12-10 NOTE — Progress Notes (Signed)
HPI: Jenna Watts is a 52 y.o. female with longstanding HIV who is here for her visit.  Allergies: Allergies  Allergen Reactions  . Blue Dyes (Parenteral)   . Penicillins   . Sulfonamide Derivatives     Vitals:    Past Medical History: No past medical history on file.  Social History: History   Social History  . Marital Status: Widowed    Spouse Name: N/A    Number of Children: N/A  . Years of Education: N/A   Social History Main Topics  . Smoking status: Never Smoker   . Smokeless tobacco: Never Used  . Alcohol Use: No  . Drug Use: No  . Sexually Active: Not Currently     Comment: given condoms   Other Topics Concern  . Not on file   Social History Narrative  . No narrative on file    Previous Regimen: ATV/r + combivir + TDF  Current Regimen: Stribild  Labs: HIV 1 RNA Quant (copies/mL)  Date Value  12/04/2012 38*  09/25/2012 17778*  07/09/2012 08657*     CD4 T Cell Abs (cmm)  Date Value  12/04/2012 320*  09/25/2012 250*  07/09/2012 310*     Hep B S Ab (no units)  Date Value  10/09/2006 No      Hepatitis B Surface Ag (no units)  Date Value  10/09/2006 No      HCV Ab (no units)  Date Value  10/09/2006 No     CrCl: The CrCl is unknown because both a height and weight (above a minimum accepted value) are required for this calculation.  Lipids:    Component Value Date/Time   CHOL 122 09/25/2012 1018   TRIG 116 09/25/2012 1018   HDL 54 09/25/2012 1018   CHOLHDL 2.3 09/25/2012 1018   VLDL 23 09/25/2012 1018   LDLCALC 45 09/25/2012 1018    Assessment: Jenna Watts is here for her follow up visit. She has had long history of noncompliance. She was on a pretty complicated regimen in the past. She also has a limited understanding of what she is supposed to do with her meds. We have simplified her regimen to stribild and fill out all of her pillboxes for her. Based on the pill counts, it seems like she may have missed one dose of drug which is much  better than what she has been doing. I refilled her pillbox and encourage her to keep up the good work.  Recommendations: Refill the pillbox Cont stribild  Jenna Watts, PharmD Clinical Infectious Disease Pharmacist [facility] for Infectious Disease 12/10/2012, 11:35 PM

## 2012-12-18 ENCOUNTER — Telehealth: Payer: Self-pay | Admitting: *Deleted

## 2012-12-18 NOTE — Telephone Encounter (Signed)
Pt needs to call [phone]) to set up appointment after receiving "new" [facility] card starting in May per [facility]

## 2013-01-01 ENCOUNTER — Encounter: Payer: Self-pay | Admitting: Internal Medicine

## 2013-01-01 ENCOUNTER — Ambulatory Visit (INDEPENDENT_AMBULATORY_CARE_PROVIDER_SITE_OTHER): Payer: Medicaid Other | Admitting: Internal Medicine

## 2013-01-01 VITALS — BP 151/96 | HR 93

## 2013-01-01 DIAGNOSIS — B20 Human immunodeficiency virus [HIV] disease: Secondary | ICD-10-CM

## 2013-01-01 NOTE — Progress Notes (Signed)
Patient ID: Jenna Watts, female   DOB: 03-07-1961, 52 y.o.   MRN: [id number]          [facility] for Infectious Disease  Patient Active Problem List   Diagnosis Date Noted  . HYPERTENSION 10/18/2007    Priority: High  . HIV DISEASE 10/13/2006    Priority: High  . Chronic bronchitis 08/04/2011    Priority: Medium  . UPPER RESPIRATORY INFECTION, ACUTE 08/24/2010  . BREAST PAIN, BILATERAL 04/12/2010  . FOOT PAIN, RIGHT 02/02/2010  . DYSPNEA ON EXERTION 09/03/2009  . CHEST PAIN, INTERMITTENT 09/03/2009  . ASCUS PAP 12/12/2008  . OBESITY 10/13/2006  . 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 CARPAL TUNNEL SYNDROME, BILATERAL 10/13/2006  . DENTAL CARIES 10/13/2006  . GERD 10/13/2006  . BOILS, RECURRENT 10/13/2006  . ALCOHOL ABUSE, HX OF 10/13/2006  . TOBACCO USE, QUIT 10/13/2006    Patient's Medications  New Prescriptions   No medications on file  Previous Medications   ALBUTEROL (PROVENTIL HFA;VENTOLIN HFA) 108 (90 BASE) MCG/ACT INHALER    Inhale 2 puffs into the lungs every 6 (six) hours as needed for wheezing.   BECLOMETHASONE (QVAR) 80 MCG/ACT INHALER    Inhale 1 puff into the lungs 2 (two) times daily.   ELVITEGRAVIR-COBICISTAT-EMTRICITABINE-TENOFOVIR (STRIBILD) 150-150-200-300 MG TABS    Take 1 tablet by mouth daily with breakfast.   HYDROCHLOROTHIAZIDE (HYDRODIURIL) 25 MG TABLET    Take 1 tablet (25 mg total) by mouth daily.  Modified Medications   No medications on file  Discontinued Medications   No medications on file    Subjective: Jenna Watts is in for her routine visit. She denies missing any doses of her Stribild or hydrochlorothiazide since her last visit. She continues to use her pillbox is which have been filled out by our pharmacist.  Review of Systems: Constitutional: negative Ears, nose, mouth, throat, and face: negative Respiratory: negative Cardiovascular: negative Gastrointestinal: negative Musculoskeletal:positive for arthralgias and stiff  joints  No past medical history on file.  History  Substance Use Topics  . Smoking status: Never Smoker   . Smokeless tobacco: Never Used  . Alcohol Use: No    No family history on file.  Allergies  Allergen Reactions  . Blue Dyes (Parenteral)   . Penicillins   . Sulfonamide Derivatives     Objective: BP: 151/96 mmHg (05/20 0905) Pulse Rate: 93 (05/20 0905)  General: She is in good spirits Oral: no oropharyngeal lesions Skin: no rash Lungs: clear Cor: regular S1 and S2 no murmurs Abdomen: obese, soft and nontender Joints and extremities: no acute abnormalities Neuro: alert and oriented  Lab Results HIV 1 RNA Quant (copies/mL)  Date Value  12/04/2012 38*  09/25/2012 17778*  07/09/2012 14782*     CD4 T Cell Abs (cmm)  Date Value  12/04/2012 320*  09/25/2012 250*  07/09/2012 310*     Assessment: I am still concerned about her ability to manage her medications. Some of her pillbox lots have medications while other days do not. She is unclear which pillbox she is using this week. She had any 1 pillbox but still had the Tuesday doses and it although she said that she took her medication at 8 AM this morning before coming to clinic. Obviously, she is taking enough of it to have a positive impact on her viral load. Adherence counseling has been provided by myself and our pharmacist, Ulyses Southward, today.  Plan: 1. Continue Stribild 2. Followup after lab work in 3 months   Cliffton Asters, MD  [facility] [phone] pager   [phone] cell 01/01/2013, 9:13 AM

## 2013-01-07 NOTE — Progress Notes (Signed)
HPI: Jenna Watts is a 52 y.o. female who is here for her f/u visit.  Allergies: Allergies  Allergen Reactions  . Blue Dyes (Parenteral)   . Penicillins   . Sulfonamide Derivatives     Vitals:    Past Medical History: No past medical history on file.  Social History: History   Social History  . Marital Status: Widowed    Spouse Name: N/A    Number of Children: N/A  . Years of Education: N/A   Social History Main Topics  . Smoking status: Never Smoker   . Smokeless tobacco: Never Used  . Alcohol Use: No  . Drug Use: No  . Sexually Active: Not Currently     Comment: given condoms   Other Topics Concern  . None   Social History Narrative  . None    Previous Regimen: ATV/r + combivir + TDF  Current Regimen: Stribild  Labs: HIV 1 RNA Quant (copies/mL)  Date Value  12/04/2012 38*  09/25/2012 17778*  07/09/2012 46962*     CD4 T Cell Abs (cmm)  Date Value  12/04/2012 320*  09/25/2012 250*  07/09/2012 310*     Hep B S Ab (no units)  Date Value  10/09/2006 No      Hepatitis B Surface Ag (no units)  Date Value  10/09/2006 No      HCV Ab (no units)  Date Value  10/09/2006 No     CrCl: The CrCl is unknown because both a height and weight (above a minimum accepted value) are required for this calculation.  Lipids:    Component Value Date/Time   CHOL 122 09/25/2012 1018   TRIG 116 09/25/2012 1018   HDL 54 09/25/2012 1018   CHOLHDL 2.3 09/25/2012 1018   VLDL 23 09/25/2012 1018   LDLCALC 45 09/25/2012 1018    Assessment: [name] is doing well since the last visit. Her VL has dropped dramatically on stribild. She didn't bring her meds today. She told me that she has been filling her own medications at home. I hope this is true. I explained that she needs to be consistent with her meds.   Recommendations: Cont Stribild   Clide Cliff, PharmD Clinical Infectious Disease Pharmacist [facility] for Infectious Disease 01/07/2013, 10:42 PM

## 2013-01-29 ENCOUNTER — Ambulatory Visit: Payer: Medicaid Other | Admitting: Pharmacist

## 2013-01-29 DIAGNOSIS — B20 Human immunodeficiency virus [HIV] disease: Secondary | ICD-10-CM

## 2013-01-29 NOTE — Assessment & Plan Note (Signed)
Jenna Watts is doing well on stribild without any side effect.  Her medbox was was missing the appropriate amount of pills and she says that she has not had any issue remembering.  [name]. [name] Clinical Pharmacist Pager [phone] Phone [phone] 01/29/2013 10:40 AM

## 2013-01-29 NOTE — Assessment & Plan Note (Signed)
She is on Qvar and albuterol at home taking both PRN. Told her to use the Qvar scheduled to try and prevent exacerbations in the future.

## 2013-01-29 NOTE — Progress Notes (Signed)
HPI: Jenna Watts is a 52 y.o. female with HIV here for follow up and filling of her medication box. She was in the ED last evening for chest pain that was worked up and said to be musculoskeletal due to effort to breath with bronchitis.  Allergies: Allergies  Allergen Reactions  . Blue Dyes (Parenteral)   . Penicillins   . Sulfonamide Derivatives     Vitals:    Past Medical History: No past medical history on file.  Social History: History   Social History  . Marital Status: Widowed    Spouse Name: N/A    Number of Children: N/A  . Years of Education: N/A   Social History Main Topics  . Smoking status: Never Smoker   . Smokeless tobacco: Never Used  . Alcohol Use: No  . Drug Use: No  . Sexually Active: Not Currently     Comment: given condoms   Other Topics Concern  . Not on file   Social History Narrative  . No narrative on file    Previous Regimen:  ATV/r + combivir + TDF   Current Regimen:  Stribild  Labs: HIV 1 RNA Quant (copies/mL)  Date Value  12/04/2012 38*  09/25/2012 17778*  07/09/2012 40981*     CD4 T Cell Abs (cmm)  Date Value  12/04/2012 320*  09/25/2012 250*  07/09/2012 310*     Hep B S Ab (no units)  Date Value  10/09/2006 No      Hepatitis B Surface Ag (no units)  Date Value  10/09/2006 No      HCV Ab (no units)  Date Value  10/09/2006 No     CrCl: Normal (SCr 0.67)  Lipids:    Component Value Date/Time   CHOL 122 09/25/2012 1018   TRIG 116 09/25/2012 1018   HDL 54 09/25/2012 1018   CHOLHDL 2.3 09/25/2012 1018   VLDL 23 09/25/2012 1018   LDLCALC 45 09/25/2012 1018

## 2013-01-30 LAB — T-HELPER CELL (CD4) - (RCID CLINIC ONLY): CD4 % Helper T Cell: 25 % — ABNORMAL LOW (ref 33–55)

## 2013-02-04 ENCOUNTER — Telehealth: Payer: Self-pay | Admitting: *Deleted

## 2013-02-04 ENCOUNTER — Ambulatory Visit: Payer: Medicaid Other

## 2013-02-04 NOTE — Telephone Encounter (Signed)
Left message requesting pt call [facility] for new PAP smear appt.  [facility] tel. # given.

## 2013-04-09 ENCOUNTER — Other Ambulatory Visit: Payer: Medicaid Other

## 2013-04-23 ENCOUNTER — Telehealth: Payer: Self-pay | Admitting: *Deleted

## 2013-04-23 ENCOUNTER — Ambulatory Visit: Payer: Medicaid Other | Admitting: Internal Medicine

## 2013-04-23 ENCOUNTER — Encounter: Payer: Self-pay | Admitting: *Deleted

## 2013-04-23 NOTE — Telephone Encounter (Signed)
Requested pt called for new appt w/ Dr. Orvan Falconer.

## 2013-05-22 ENCOUNTER — Other Ambulatory Visit: Payer: Medicaid Other

## 2013-06-10 ENCOUNTER — Other Ambulatory Visit: Payer: Self-pay | Admitting: Internal Medicine

## 2013-06-10 DIAGNOSIS — J45909 Unspecified asthma, uncomplicated: Secondary | ICD-10-CM

## 2013-06-10 DIAGNOSIS — I10 Essential (primary) hypertension: Secondary | ICD-10-CM

## 2013-06-12 ENCOUNTER — Encounter: Payer: Self-pay | Admitting: Internal Medicine

## 2013-06-12 ENCOUNTER — Ambulatory Visit (INDEPENDENT_AMBULATORY_CARE_PROVIDER_SITE_OTHER): Payer: Medicaid Other | Admitting: Internal Medicine

## 2013-06-12 VITALS — BP 144/93 | HR 72 | Temp 98.0°F | Ht 62.0 in | Wt 234.0 lb

## 2013-06-12 DIAGNOSIS — B20 Human immunodeficiency virus [HIV] disease: Secondary | ICD-10-CM

## 2013-06-12 DIAGNOSIS — Z23 Encounter for immunization: Secondary | ICD-10-CM

## 2013-06-12 LAB — CBC
HCT: 39.3 % (ref 36.0–46.0)
MCHC: 34.6 g/dL (ref 30.0–36.0)
MCV: 81.4 fL (ref 78.0–100.0)
RDW: 18.8 % — ABNORMAL HIGH (ref 11.5–15.5)

## 2013-06-12 LAB — COMPREHENSIVE METABOLIC PANEL
AST: 14 U/L (ref 0–37)
Albumin: 4 g/dL (ref 3.5–5.2)
Alkaline Phosphatase: 65 U/L (ref 39–117)
BUN: 10 mg/dL (ref 6–23)
Creat: 0.62 mg/dL (ref 0.50–1.10)
Potassium: 3.7 mEq/L (ref 3.5–5.3)
Total Protein: 7.3 g/dL (ref 6.0–8.3)

## 2013-06-12 NOTE — Progress Notes (Signed)
Patient ID: Jenna Watts, female   DOB: [birth year], 52 y.o.   MRN: [id number]          [facility]  Patient Active Problem List   Diagnosis Date Noted  . HYPERTENSION 10/18/2007    Priority: High  . HIV DISEASE 10/13/2006    Priority: High  . Chronic bronchitis 08/04/2011    Priority: Medium  . UPPER RESPIRATORY INFECTION, ACUTE 08/24/2010  . BREAST PAIN, BILATERAL 04/12/2010  . FOOT PAIN, RIGHT 02/02/2010  . DYSPNEA ON EXERTION 09/03/2009  . CHEST PAIN, INTERMITTENT 09/03/2009  . ASCUS PAP 12/12/2008  . OBESITY 10/13/2006  . 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 CARPAL TUNNEL SYNDROME, BILATERAL 10/13/2006  . DENTAL CARIES 10/13/2006  . GERD 10/13/2006  . BOILS, RECURRENT 10/13/2006  . ALCOHOL ABUSE, HX OF 10/13/2006  . TOBACCO USE, QUIT 10/13/2006    Patient's Medications  New Prescriptions   No medications on file  Previous Medications   DICLOFENAC SODIUM (VOLTAREN) 1 % GEL    Apply 1 application topically 4 (four) times daily as needed.   ELVITEGRAVIR-COBICISTAT-EMTRICITABINE-TENOFOVIR (STRIBILD) 150-150-200-300 MG TABS    Take 1 tablet by mouth daily with breakfast.   HYDROCHLOROTHIAZIDE (HYDRODIURIL) 25 MG TABLET    TAKE 1 TABLET BY MOUTH DAILY   PROVENTIL HFA 108 (90 BASE) MCG/ACT INHALER    INHALE 2 PUFFS BY MOUTH EVERY 6 HOURS AS NEEDED FOR WHEEZING   QVAR 80 MCG/ACT INHALER    INHALE 1 PUFF BY MOUTH TWICE DAILY  Modified Medications   No medications on file  Discontinued Medications   No medications on file    Subjective: Jenna Watts is in for her routine visit. She has 2 bottles of Stribild with her daughter both partially empty. She has her for pillboxes all of which are full. Some days are filled incorrectly. She denies missing doses. She is back in school 3 days each week. Review of Systems: Pertinent items are noted in HPI.  No past medical history on file.  History  Substance Use Topics  . Smoking status: Never Smoker   .  Smokeless tobacco: Never Used  . Alcohol Use: No    No family history on file.  Allergies  Allergen Reactions  . Blue Dyes (Parenteral)   . Penicillins   . Sulfonamide Derivatives     Objective: Temp: 98 F (36.7 C) (10/29 0959) Temp src: Oral (10/29 0959) BP: 144/93 mmHg (10/29 0959) Pulse Rate: 72 (10/29 0959)  General: She is in good spirits.  Oral: No oropharyngeal lesions Skin: No rash Lungs: Clear Cor: Regular S1 and S2 no murmurs  HIV 1 RNA Quant (copies/mL)  Date Value  12/04/2012 38*  09/25/2012 17778*  07/09/2012 29528*     CD4 T Cell Abs (cmm)  Date Value  01/29/2013 250*  12/04/2012 320*  09/25/2012 250*     Assessment: Overall, she seems to be doing better with the appearance but still makes some mistakes. I will simplify her to one pill box. I filled out for her today. I will recheck her lab work and see her back in 4 weeks.  Plan: 1. Continue Stribild 2. Medication adherence counseling provided 3. Check laboratory today 4. Followup in 4 weeks   Cliffton Asters, MD [facility] [phone] pager   [phone] cell 06/12/2013, 10:18 AM

## 2013-06-13 LAB — HIV-1 RNA QUANT-NO REFLEX-BLD
HIV 1 RNA Quant: <20 copies/mL (ref <20)
HIV-1 RNA Quant, Log: <1.3 {Log} (ref <1.30)

## 2013-07-09 ENCOUNTER — Encounter: Payer: Self-pay | Admitting: Internal Medicine

## 2013-07-09 ENCOUNTER — Ambulatory Visit (INDEPENDENT_AMBULATORY_CARE_PROVIDER_SITE_OTHER): Payer: Medicaid Other | Admitting: Internal Medicine

## 2013-07-09 VITALS — BP 147/97 | HR 65 | Ht 62.0 in | Wt 230.0 lb

## 2013-07-09 DIAGNOSIS — B20 Human immunodeficiency virus [HIV] disease: Secondary | ICD-10-CM

## 2013-07-09 NOTE — Progress Notes (Signed)
  [facility] - Pharmacist    HPI: Jenna Watts is a 52 y.o. female here for routine follow-up.  She brought her medication bottles and pillbox for review.  Allergies: Allergies  Allergen Reactions  . Blue Dyes (Parenteral)   . Penicillins   . Sulfonamide Derivatives     Vitals: BP: 147/97 mmHg (11/25 0932) Pulse Rate: 65 (11/25 0932)  Past Medical History: No past medical history on file.  Social History: History   Social History  . Marital Status: Widowed    Spouse Name: N/A    Number of Children: N/A  . Years of Education: N/A   Social History Main Topics  . Smoking status: Never Smoker   . Smokeless tobacco: Never Used  . Alcohol Use: No  . Drug Use: No  . Sexual Activity: Not Currently     Comment: given condoms   Other Topics Concern  . None   Social History Narrative  . None    Current Regimen: Stribild 1 tab PO daily  Labs: HIV 1 RNA Quant (copies/mL)  Date Value  06/12/2013 <20   12/04/2012 38*  09/25/2012 17778*     CD4 T Cell Abs (/uL)  Date Value  06/12/2013 460   01/29/2013 250*  12/04/2012 320*     Hep B S Ab (no units)  Date Value  10/09/2006 No      Hepatitis B Surface Ag (no units)  Date Value  10/09/2006 No      HCV Ab (no units)  Date Value  10/09/2006 No     CrCl: Estimated Creatinine Clearance: 95.4 ml/min (by C-G formula based on Cr of 0.62).  Lipids:    Component Value Date/Time   CHOL 122 09/25/2012 1018   TRIG 116 09/25/2012 1018   HDL 54 09/25/2012 1018   CHOLHDL 2.3 09/25/2012 1018   VLDL 23 09/25/2012 1018   LDLCALC 45 09/25/2012 1018    Assessment: Medication adherence - Jenna Watts reports excellent adherence with no missed doses.  Her pillbox is filled in correctly, and she brought all of her medications except for her inhalers.  She is taking QVAR and albuterol on an as-needed basis.  Recommendations: Medication adherence praised and reinforced. Encouraged continued use of her  pillbox. Recommended using QVAR scheduled instead of as needed, but she wants to wait until her new pulmonology appointment in January.  Sallee Provencal, Pharm.D., BCPS, AAHIVP Clinical Infectious Disease Pharmacist [facility] 07/09/2013, 9:48 AM

## 2013-07-09 NOTE — Progress Notes (Signed)
Patient ID: Jenna Watts, female   DOB: September 15, 1960, 52 y.o.   MRN: [id number]          [facility]  Patient Active Problem List   Diagnosis Date Noted  . HYPERTENSION 10/18/2007    Priority: High  . HIV DISEASE 10/13/2006    Priority: High  . Chronic bronchitis 08/04/2011    Priority: Medium  . UPPER RESPIRATORY INFECTION, ACUTE 08/24/2010  . BREAST PAIN, BILATERAL 04/12/2010  . FOOT PAIN, RIGHT 02/02/2010  . DYSPNEA ON EXERTION 09/03/2009  . CHEST PAIN, INTERMITTENT 09/03/2009  . ASCUS PAP 12/12/2008  . OBESITY 10/13/2006  . 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 CARPAL TUNNEL SYNDROME, BILATERAL 10/13/2006  . DENTAL CARIES 10/13/2006  . GERD 10/13/2006  . BOILS, RECURRENT 10/13/2006  . ALCOHOL ABUSE, HX OF 10/13/2006  . TOBACCO USE, QUIT 10/13/2006    Patient's Medications  New Prescriptions   No medications on file  Previous Medications   DICLOFENAC SODIUM (VOLTAREN) 1 % GEL    Apply 1 application topically 4 (four) times daily as needed.   ELVITEGRAVIR-COBICISTAT-EMTRICITABINE-TENOFOVIR (STRIBILD) 150-150-200-300 MG TABS    Take 1 tablet by mouth daily with breakfast.   ERGOCALCIFEROL (VITAMIN D2) 50000 UNITS CAPSULE    Take 50,000 Units by mouth once a week. Take on Mondays.   HYDROCHLOROTHIAZIDE (HYDRODIURIL) 25 MG TABLET    TAKE 1 TABLET BY MOUTH DAILY   PROVENTIL HFA 108 (90 BASE) MCG/ACT INHALER    INHALE 2 PUFFS BY MOUTH EVERY 6 HOURS AS NEEDED FOR WHEEZING   QVAR 80 MCG/ACT INHALER    INHALE 1 PUFF BY MOUTH TWICE DAILY  Modified Medications   No medications on file  Discontinued Medications   No medications on file    Subjective: Era is in for routine visit. She is using one pill box and finds it very easy to use. She denies missing any doses of her Stribild since her last visit. She is feeling better. She did develop hives about one week after getting her flu shot and wondered if the 2 were related. She went to the [facility] emergency department and was treated with Benadryl and improved. She is exercising more and trying to eat more salads. She is happy that she has lost some weight.  Review of Systems: Pertinent items are noted in HPI.  No past medical history on file.  History  Substance Use Topics  . Smoking status: Never Smoker   . Smokeless tobacco: Never Used  . Alcohol Use: No    No family history on file.  Allergies  Allergen Reactions  . Blue Dyes (Parenteral)   . Penicillins   . Sulfonamide Derivatives     Objective: BP: 147/97 mmHg (11/25 0932) Pulse Rate: 65 (11/25 0932)  General: She is in good spirits Oral: No oropharyngeal lesions Skin: No rash Lungs: Clear Cor: Regular S1 and S2 no murmurs  Lab Results HIV 1 RNA Quant (copies/mL)  Date Value  06/12/2013 <20   12/04/2012 38*  09/25/2012 17778*     CD4 T Cell Abs (/uL)  Date Value  06/12/2013 460   01/29/2013 250*  12/04/2012 320*     Lab Results  Component Value Date   WBC 6.7 06/12/2013   HGB 13.6 06/12/2013   HCT 39.3 06/12/2013   MCV 81.4 06/12/2013   PLT 342 06/12/2013   BMET    Component Value Date/Time   NA 142 06/12/2013 1114   K 3.7 06/12/2013 1114  CL 100 06/12/2013 1114   CO2 29 06/12/2013 1114   GLUCOSE 72 06/12/2013 1114   BUN 10 06/12/2013 1114   CREATININE 0.62 06/12/2013 1114   CREATININE 0.66 08/24/2010 2027   CALCIUM 10.0 06/12/2013 1114   GFRNONAA >60 11/24/2008 2112   GFRAA >60 11/24/2008 2112    Assessment: Toria's appearance has improved markedly and as a result her HIV infection is under better control than it has been in many, many years.  Plan: 1. Continue Stribild 2. Followup after lab work in 3 months   Cliffton Asters, MD [facility] [phone] pager   [phone] cell 07/09/2013, 9:59 AM

## 2013-07-10 ENCOUNTER — Other Ambulatory Visit: Payer: Self-pay | Admitting: Internal Medicine

## 2013-07-10 DIAGNOSIS — I10 Essential (primary) hypertension: Secondary | ICD-10-CM

## 2013-09-10 ENCOUNTER — Other Ambulatory Visit: Payer: Self-pay | Admitting: Internal Medicine

## 2013-10-09 ENCOUNTER — Other Ambulatory Visit: Payer: Medicaid Other

## 2013-10-17 ENCOUNTER — Other Ambulatory Visit: Payer: Self-pay | Admitting: *Deleted

## 2013-10-17 DIAGNOSIS — B20 Human immunodeficiency virus [HIV] disease: Secondary | ICD-10-CM

## 2013-10-17 DIAGNOSIS — I10 Essential (primary) hypertension: Secondary | ICD-10-CM

## 2013-10-17 MED ORDER — ELVITEG-COBIC-EMTRICIT-TENOFDF 150-150-200-300 MG PO TABS: ORAL_TABLET | ORAL | Status: DC

## 2013-10-22 ENCOUNTER — Other Ambulatory Visit: Payer: Self-pay | Admitting: Internal Medicine

## 2013-10-22 ENCOUNTER — Ambulatory Visit: Payer: Medicaid Other | Admitting: Internal Medicine

## 2013-10-22 ENCOUNTER — Telehealth: Payer: Self-pay | Admitting: *Deleted

## 2013-10-22 DIAGNOSIS — J45909 Unspecified asthma, uncomplicated: Secondary | ICD-10-CM

## 2013-10-22 NOTE — Telephone Encounter (Signed)
no transportation today

## 2013-11-04 ENCOUNTER — Encounter: Payer: Self-pay | Admitting: Internal Medicine

## 2013-11-04 ENCOUNTER — Ambulatory Visit (INDEPENDENT_AMBULATORY_CARE_PROVIDER_SITE_OTHER): Payer: Medicaid Other | Admitting: Internal Medicine

## 2013-11-04 VITALS — BP 143/92 | HR 77 | Ht 62.0 in | Wt 250.2 lb

## 2013-11-04 DIAGNOSIS — B20 Human immunodeficiency virus [HIV] disease: Secondary | ICD-10-CM

## 2013-11-04 DIAGNOSIS — Z113 Encounter for screening for infections with a predominantly sexual mode of transmission: Secondary | ICD-10-CM

## 2013-11-04 DIAGNOSIS — Z79899 Other long term (current) drug therapy: Secondary | ICD-10-CM

## 2013-11-04 LAB — CBC
HEMATOCRIT: 39.2 % (ref 36.0–46.0)
HEMOGLOBIN: 13.6 g/dL (ref 12.0–15.0)
MCH: 30.1 pg (ref 26.0–34.0)
MCHC: 34.7 g/dL (ref 30.0–36.0)
MCV: 86.7 fL (ref 78.0–100.0)
Platelets: 311 10*3/uL (ref 150–400)
RBC: 4.52 MIL/uL (ref 3.87–5.11)
RDW: 14.2 % (ref 11.5–15.5)
WBC: 9.7 10*3/uL (ref 4.0–10.5)

## 2013-11-04 LAB — COMPREHENSIVE METABOLIC PANEL
ALT: 13 U/L (ref 0–35)
AST: 14 U/L (ref 0–37)
Albumin: 3.8 g/dL (ref 3.5–5.2)
Alkaline Phosphatase: 74 U/L (ref 39–117)
BILIRUBIN TOTAL: 0.3 mg/dL (ref 0.2–1.2)
BUN: 9 mg/dL (ref 6–23)
CO2: 27 mEq/L (ref 19–32)
CREATININE: 0.62 mg/dL (ref 0.50–1.10)
Calcium: 9 mg/dL (ref 8.4–10.5)
Chloride: 100 mEq/L (ref 96–112)
Glucose, Bld: 82 mg/dL (ref 70–99)
Potassium: 3.6 mEq/L (ref 3.5–5.3)
Sodium: 139 mEq/L (ref 135–145)
Total Protein: 6.9 g/dL (ref 6.0–8.3)

## 2013-11-04 LAB — LIPID PANEL
Cholesterol: 139 mg/dL (ref 0–200)
HDL: 70 mg/dL (ref >39)
LDL CALC: 44 mg/dL (ref 0–99)
Total CHOL/HDL Ratio: 2 Ratio
Triglycerides: 127 mg/dL (ref <150)
VLDL: 25 mg/dL (ref 0–40)

## 2013-11-04 NOTE — Progress Notes (Signed)
Patient ID: Jenna Watts, female   DOB: July 23, 1961, 53 y.o.   MRN: [id number] @LOGODEPT         Patient Active Problem List   Diagnosis Date Noted  . HYPERTENSION 10/18/2007    Priority: High  . HIV DISEASE 10/13/2006    Priority: High  . Chronic bronchitis 08/04/2011    Priority: Medium  . UPPER RESPIRATORY INFECTION, ACUTE 08/24/2010  . BREAST PAIN, BILATERAL 04/12/2010  . FOOT PAIN, RIGHT 02/02/2010  . DYSPNEA ON EXERTION 09/03/2009  . CHEST PAIN, INTERMITTENT 09/03/2009  . ASCUS PAP 12/12/2008  . OBESITY 10/13/2006  . 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 CARPAL TUNNEL SYNDROME, BILATERAL 10/13/2006  . DENTAL CARIES 10/13/2006  . GERD 10/13/2006  . BOILS, RECURRENT 10/13/2006  . ALCOHOL ABUSE, HX OF 10/13/2006  . TOBACCO USE, QUIT 10/13/2006    Patient's Medications  New Prescriptions   No medications on file  Previous Medications   DICLOFENAC SODIUM (VOLTAREN) 1 % GEL    Apply 1 application topically 4 (four) times daily as needed.   ELVITEGRAVIR-COBICISTAT-EMTRICITABINE-TENOFOVIR (STRIBILD) 150-150-200-300 MG TABS TABLET    TAKE 1 TABLET BY MOUTH EVERY DAY WITH BREAKFAST   ERGOCALCIFEROL (VITAMIN D2) 50000 UNITS CAPSULE    Take 50,000 Units by mouth once a week. Take on Mondays.   HYDROCHLOROTHIAZIDE (HYDRODIURIL) 25 MG TABLET    TAKE 1 TABLET BY MOUTH DAILY   PROVENTIL HFA 108 (90 BASE) MCG/ACT INHALER    INHALE 2 PUFFS BY MOUTH EVERY 6 HOURS AS NEEDED FOR WHEEZING   QVAR 80 MCG/ACT INHALER    INHALE 1 PUFF BY MOUTH TWICE DAILY  Modified Medications   No medications on file  Discontinued Medications   No medications on file    Subjective: Jenna Watts is in for her routine visit. She had her hydrochlorothiazide and Stribild with her. She states that she is using her pillbox and has not missed any of her medications. She says she feels about her pull box every Sunday but the pillbox does not seem to be filled out correctly. He cannot tell me which bottle contains her HIV  medication. She states she is feeling well without any current complaints. Review of Systems: Pertinent items are noted in HPI.  No past medical history on file.  History  Substance Use Topics  . Smoking status: Never Smoker   . Smokeless tobacco: Never Used  . Alcohol Use: No    No family history on file.  Allergies  Allergen Reactions  . Blue Dyes (Parenteral)   . Penicillins   . Sulfonamide Derivatives     Objective: BP: 143/92 mmHg (03/23 1441) Pulse Rate: 77 (03/23 1441) Body mass index is 45.76 kg/(m^2).  General: She is in no distress Oral: No oropharyngeal lesions and teeth are in good condition Skin: No rash Lungs: Clear Cor: Regular S1 and S2 with no murmurs Abdomen: Obese, soft and nontender  Lab Results Lab Results  Component Value Date   WBC 6.7 06/12/2013   HGB 13.6 06/12/2013   HCT 39.3 06/12/2013   MCV 81.4 06/12/2013   PLT 342 06/12/2013    Lab Results  Component Value Date   CREATININE 0.62 06/12/2013   BUN 10 06/12/2013   NA 142 06/12/2013   K 3.7 06/12/2013   CL 100 06/12/2013   CO2 29 06/12/2013    Lab Results  Component Value Date   ALT 12 06/12/2013   AST 14 06/12/2013   ALKPHOS 65 06/12/2013   BILITOT 0.3 06/12/2013    Lab  Results  Component Value Date   CHOL 122 09/25/2012   HDL 54 09/25/2012   LDLCALC 45 09/25/2012   TRIG 116 09/25/2012   CHOLHDL 2.3 09/25/2012    Lab Results HIV 1 RNA Quant (copies/mL)  Date Value  06/12/2013 <20   12/04/2012 38*  09/25/2012 17778*     CD4 T Cell Abs (/uL)  Date Value  06/12/2013 460   01/29/2013 250*  12/04/2012 320*     Assessment: It is difficult to make an accurate assessment of her adherence. I will continue Stribild for now and repeat lab work today.  Plan: 1. Continue Stribild 2. Repeat lab work 3. Followup in 3 months   Cliffton AstersJohn Charnette Younkin, MD [facility] [phone] pager   [phone] cell 11/04/2013, 2:53 PM

## 2013-11-05 LAB — HIV-1 RNA QUANT-NO REFLEX-BLD: HIV 1 RNA Quant: <20 copies/mL (ref <20)

## 2013-11-05 LAB — RPR

## 2013-11-05 LAB — T-HELPER CELL (CD4) - (RCID CLINIC ONLY)
CD4 % Helper T Cell: 21 % — ABNORMAL LOW (ref 33–55)
CD4 T CELL ABS: 630 /uL (ref 400–2700)

## 2013-11-13 ENCOUNTER — Other Ambulatory Visit: Payer: Self-pay | Admitting: Internal Medicine

## 2013-11-19 ENCOUNTER — Other Ambulatory Visit: Payer: Self-pay | Admitting: Internal Medicine

## 2013-11-19 DIAGNOSIS — I10 Essential (primary) hypertension: Secondary | ICD-10-CM

## 2013-12-19 ENCOUNTER — Other Ambulatory Visit: Payer: Self-pay | Admitting: Internal Medicine

## 2013-12-19 ENCOUNTER — Telehealth: Payer: Self-pay | Admitting: *Deleted

## 2013-12-19 NOTE — Telephone Encounter (Signed)
Pt requested return call.  Left message with times to call back.

## 2013-12-24 ENCOUNTER — Other Ambulatory Visit: Payer: Self-pay | Admitting: Internal Medicine

## 2014-01-28 ENCOUNTER — Other Ambulatory Visit: Payer: Self-pay | Admitting: Internal Medicine

## 2014-02-04 ENCOUNTER — Ambulatory Visit: Payer: Medicaid Other | Admitting: Internal Medicine

## 2014-02-18 ENCOUNTER — Ambulatory Visit (INDEPENDENT_AMBULATORY_CARE_PROVIDER_SITE_OTHER): Payer: Medicaid Other | Admitting: Internal Medicine

## 2014-02-18 VITALS — BP 182/125 | HR 96 | Wt 250.5 lb

## 2014-02-18 DIAGNOSIS — B20 Human immunodeficiency virus [HIV] disease: Secondary | ICD-10-CM

## 2014-02-18 NOTE — Progress Notes (Signed)
Patient ID: Jenna Watts, female   DOB: 08-08-1961, 53 y.o.   MRN: [id number]          Patient Active Problem List   Diagnosis Date Noted  . HYPERTENSION 10/18/2007    Priority: High  . HIV DISEASE 10/13/2006    Priority: High  . Chronic bronchitis 08/04/2011    Priority: Medium  . UPPER RESPIRATORY INFECTION, ACUTE 08/24/2010  . BREAST PAIN, BILATERAL 04/12/2010  . FOOT PAIN, RIGHT 02/02/2010  . DYSPNEA ON EXERTION 09/03/2009  . CHEST PAIN, INTERMITTENT 09/03/2009  . ASCUS PAP 12/12/2008  . OBESITY 10/13/2006  . 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 CARPAL TUNNEL SYNDROME, BILATERAL 10/13/2006  . DENTAL CARIES 10/13/2006  . GERD 10/13/2006  . BOILS, RECURRENT 10/13/2006  . ALCOHOL ABUSE, HX OF 10/13/2006  . TOBACCO USE, QUIT 10/13/2006    Patient's Medications  New Prescriptions   No medications on file  Previous Medications   ELVITEGRAVIR-COBICISTAT-EMTRICITABINE-TENOFOVIR (STRIBILD) 150-150-200-300 MG TABS TABLET    TAKE 1 TABLET BY MOUTH EVERY DAY WITH BREAKFAST   ERGOCALCIFEROL (VITAMIN D2) 50000 UNITS CAPSULE    Take 50,000 Units by mouth once a week. Take on Mondays.   HYDROCHLOROTHIAZIDE (HYDRODIURIL) 25 MG TABLET    TAKE 1 TABLET BY MOUTH DAILY   IRON-FA-B CMP-C-BIOT-PROBIOTIC (FUSION PLUS) CAPS    Take 1 capsule by mouth daily.   POTASSIUM CHLORIDE (K-DUR) 10 MEQ TABLET    TAKE 1 TABLET BY MOUTH EVERY DAY   PROVENTIL HFA 108 (90 BASE) MCG/ACT INHALER    INHALE 2 PUFFS BY MOUTH EVERY 6 HOURS AS NEEDED FOR WHEEZING   QVAR 80 MCG/ACT INHALER    INHALE 1 PUFF BY MOUTH TWICE DAILY   VOLTAREN 1 % GEL    APPLY 2-4 GRAMS TO THE AFFECTED AREAS 4 TIMES A DAY AS NEEDED  Modified Medications   No medications on file  Discontinued Medications   No medications on file    Subjective: Jenna Watts is in for her routine visit. She has her medications with her as well as her pill box. She denies missing any doses of her Stribild. She states that she's had recent problems with pain  and swelling in her legs. She states that on July 4 her legs "gave out" causing her to fall. She is able to get up without assistance. She denies any loss of consciousness. She says that she may have tripped on something.  Review of Systems: Pertinent items are noted in HPI.  No past medical history on file.  History  Substance Use Topics  . Smoking status: Never Smoker   . Smokeless tobacco: Never Used  . Alcohol Use: No    No family history on file.  Allergies  Allergen Reactions  . Blue Dyes (Parenteral)   . Penicillins   . Sulfonamide Derivatives     Objective: BP: 182/125 mmHg (07/07 0934) Pulse Rate: 96 (07/07 0934) Body mass index is 45.81 kg/(m^2).  General: She is alert, in no distress Oral: No oropharyngeal lesions Skin: No rash. She has 2 blackouts it she states resulted from her fall. Lungs: Clear Cor: Regular S1 and S2 no murmurs   Lab Results Lab Results  Component Value Date   WBC 9.7 11/04/2013   HGB 13.6 11/04/2013   HCT 39.2 11/04/2013   MCV 86.7 11/04/2013   PLT 311 11/04/2013    Lab Results  Component Value Date   CREATININE 0.62 11/04/2013   BUN 9 11/04/2013   NA 139 11/04/2013   K 3.6  11/04/2013   CL 100 11/04/2013   CO2 27 11/04/2013    Lab Results  Component Value Date   ALT 13 11/04/2013   AST 14 11/04/2013   ALKPHOS 74 11/04/2013   BILITOT 0.3 11/04/2013    Lab Results  Component Value Date   CHOL 139 11/04/2013   HDL 70 11/04/2013   LDLCALC 44 11/04/2013   TRIG 127 11/04/2013   CHOLHDL 2.0 11/04/2013    Lab Results HIV 1 RNA Quant (copies/mL)  Date Value  11/04/2013 <20   06/12/2013 <20   12/04/2012 38*     CD4 T Cell Abs (/uL)  Date Value  11/04/2013 630   06/12/2013 460   01/29/2013 250*     Assessment: Her adherence has been much improved over the last year and her HIV infection has come under excellent control on Stribild.  Plan: 1. Continue Stribild 2. She will call her primary care provider this afternoon for further  evaluation of her blood pressure, like pain and recent fall 3. Followup here after blood work in 3 months   Cliffton AstersJohn Shaela Boer, MD [facility] [phone] pager   [phone] cell 02/18/2014, 9:47 AM

## 2014-02-21 ENCOUNTER — Other Ambulatory Visit: Payer: Self-pay | Admitting: Internal Medicine

## 2014-02-26 ENCOUNTER — Other Ambulatory Visit: Payer: Self-pay | Admitting: Internal Medicine

## 2014-03-14 ENCOUNTER — Other Ambulatory Visit (HOSPITAL_COMMUNITY)
Admission: RE | Admit: 2014-03-14 | Discharge: 2014-03-14 | Disposition: A | Discharge status: Home / Self Care | Payer: Medicaid Other | Source: Ambulatory Visit | Attending: Internal Medicine | Admitting: Internal Medicine

## 2014-03-14 ENCOUNTER — Ambulatory Visit (INDEPENDENT_AMBULATORY_CARE_PROVIDER_SITE_OTHER): Payer: Medicaid Other | Admitting: *Deleted

## 2014-03-14 DIAGNOSIS — Z113 Encounter for screening for infections with a predominantly sexual mode of transmission: Secondary | ICD-10-CM | POA: Diagnosis present

## 2014-03-14 DIAGNOSIS — Z1231 Encounter for screening mammogram for malignant neoplasm of breast: Secondary | ICD-10-CM

## 2014-03-14 DIAGNOSIS — R87612 Low grade squamous intraepithelial lesion on cytologic smear of cervix (LGSIL): Secondary | ICD-10-CM

## 2014-03-14 DIAGNOSIS — Z124 Encounter for screening for malignant neoplasm of cervix: Secondary | ICD-10-CM | POA: Insufficient documentation

## 2014-03-14 NOTE — Progress Notes (Addendum)
  Subjective:     Jenna Watts is a 53 y.o. woman who comes in today for a  pap smear only.  Previous abnormal Pap smears: no. Contraception: condoms.  Objective:    LMP 02/16/2014 Pelvic Exam: Pap smear obtained.   Assessment:    Screening pap smear.   Plan:    Follow up in one year, or as indicated by Pap results.   Pt given educational materials re: HIV and women, self-esteem, BSE, nutrition and diet management, PAP smears and partner safety. Pt given condoms.     Abnormal PAP smear results - referred pt to [facility].  Pt left a message about the abnormal results and referral to gynecology.  Will call pt back with details of the gynecology appt.

## 2014-03-14 NOTE — Patient Instructions (Signed)
Your results will be ready in about a week.  I will mail them to you.  Thank you for coming to the Center for your care.  Angelique Blonderenise, [place]

## 2014-03-17 LAB — CYTOLOGY - PAP

## 2014-03-19 NOTE — Addendum Note (Signed)
Addended by: Jennet MaduroESTRIDGE, DENISE D on: 03/19/2014 03:07 PM   Modules accepted: Orders

## 2014-03-29 ENCOUNTER — Other Ambulatory Visit: Payer: Self-pay | Admitting: Internal Medicine

## 2014-04-30 ENCOUNTER — Encounter: Payer: Medicaid Other | Admitting: Obstetrics & Gynecology

## 2014-05-20 ENCOUNTER — Ambulatory Visit: Payer: Medicaid Other | Admitting: Internal Medicine

## 2014-05-26 ENCOUNTER — Other Ambulatory Visit: Payer: Self-pay | Admitting: Internal Medicine

## 2014-06-03 ENCOUNTER — Encounter: Payer: Self-pay | Admitting: Internal Medicine

## 2014-06-03 ENCOUNTER — Ambulatory Visit (INDEPENDENT_AMBULATORY_CARE_PROVIDER_SITE_OTHER): Payer: Medicaid Other | Admitting: Internal Medicine

## 2014-06-03 VITALS — BP 143/97 | HR 77 | Temp 98.4°F | Ht 63.0 in | Wt 267.0 lb

## 2014-06-03 DIAGNOSIS — B20 Human immunodeficiency virus [HIV] disease: Secondary | ICD-10-CM

## 2014-06-03 NOTE — Progress Notes (Signed)
Patient ID: Jenna Watts, female   DOB: 1960-09-09, 53 y.o.   MRN: [id number]          Patient Active Problem List   Diagnosis Date Noted  . HYPERTENSION 10/18/2007    Priority: High  . Human immunodeficiency virus (HIV) disease 10/13/2006    Priority: High  . Chronic bronchitis 08/04/2011    Priority: Medium  . UPPER RESPIRATORY INFECTION, ACUTE 08/24/2010  . BREAST PAIN, BILATERAL 04/12/2010  . FOOT PAIN, RIGHT 02/02/2010  . DYSPNEA ON EXERTION 09/03/2009  . CHEST PAIN, INTERMITTENT 09/03/2009  . ASCUS PAP 12/12/2008  . OBESITY 10/13/2006  . 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 CARPAL TUNNEL SYNDROME, BILATERAL 10/13/2006  . DENTAL CARIES 10/13/2006  . GERD 10/13/2006  . BOILS, RECURRENT 10/13/2006  . ALCOHOL ABUSE, HX OF 10/13/2006  . TOBACCO USE, QUIT 10/13/2006    Patient's Medications  New Prescriptions   No medications on file  Previous Medications   ELVITEGRAVIR-COBICISTAT-EMTRICITABINE-TENOFOVIR (STRIBILD) 150-150-200-300 MG TABS TABLET    TAKE 1 TABLET BY MOUTH EVERY DAY WITH BREAKFAST   GABAPENTIN (NEURONTIN) 600 MG TABLET    Take 600 mg by mouth 2 (two) times daily.   HYDROCHLOROTHIAZIDE (HYDRODIURIL) 25 MG TABLET    TAKE 1 TABLET BY MOUTH DAILY   POTASSIUM CHLORIDE (K-DUR) 10 MEQ TABLET    TAKE 1 TABLET BY MOUTH EVERY DAY   PROPRANOLOL (INDERAL) 10 MG TABLET    Take 10 mg by mouth 2 (two) times daily. Ordered by PCP Darryl LentAmanda Taylor, PA-C   PROVENTIL HFA 108 (90 BASE) MCG/ACT INHALER    INHALE 2 PUFFS BY MOUTH EVERY 6 HOURS AS NEEDED FOR WHEEZING   QVAR 80 MCG/ACT INHALER    INHALE 1 PUFF BY MOUTH TWICE DAILY   VOLTAREN 1 % GEL    APPLY 2-4 GRAMS TO THE AFFECTED AREAS 4 TIMES A DAY AS NEEDED  Modified Medications   No medications on file  Discontinued Medications   ERGOCALCIFEROL (VITAMIN D2) 50000 UNITS CAPSULE    Take 50,000 Units by mouth once a week. Take on Mondays.   IRON-FA-B CMP-C-BIOT-PROBIOTIC (FUSION PLUS) CAPS    Take 1 capsule by mouth daily.     Subjective: Jenna Watts is in for her routine visit. She has her pill box with her and it is filled out correctly. She denies missing any doses of her Stribild since her last visit. Review of Systems: Pertinent items are noted in HPI.  No past medical history on file.  History  Substance Use Topics  . Smoking status: Never Smoker   . Smokeless tobacco: Never Used  . Alcohol Use: No    No family history on file.  Allergies  Allergen Reactions  . Blue Dyes (Parenteral)   . Penicillins   . Sulfonamide Derivatives     Objective: Temp: 98.4 F (36.9 C) (10/20 1001) Temp Source: Oral (10/20 1001) BP: 143/97 mmHg (10/20 1001) Pulse Rate: 77 (10/20 1001) Body mass index is 47.31 kg/(m^2).  General: She is in good spirits Oral: No oropharyngeal lesions Skin: No rash Lungs: Clear Cor: Regular S1 and S2 with no murmur  Lab Results Lab Results  Component Value Date   WBC 9.7 11/04/2013   HGB 13.6 11/04/2013   HCT 39.2 11/04/2013   MCV 86.7 11/04/2013   PLT 311 11/04/2013    Lab Results  Component Value Date   CREATININE 0.62 11/04/2013   BUN 9 11/04/2013   NA 139 11/04/2013   K 3.6 11/04/2013   CL 100 11/04/2013  CO2 27 11/04/2013    Lab Results  Component Value Date   ALT 13 11/04/2013   AST 14 11/04/2013   ALKPHOS 74 11/04/2013   BILITOT 0.3 11/04/2013    Lab Results  Component Value Date   CHOL 139 11/04/2013   HDL 70 11/04/2013   LDLCALC 44 11/04/2013   TRIG 127 11/04/2013   CHOLHDL 2.0 11/04/2013    Lab Results HIV 1 RNA Quant (copies/mL)  Date Value  11/04/2013 <20   06/12/2013 <20   12/04/2012 38*     CD4 T Cell Abs (/uL)  Date Value  11/04/2013 630   06/12/2013 460   01/29/2013 250*     Assessment: Jenna Watts his adherence has markedly improved in the past year and a half. She appears to be taking her Stribild correctly and consistently. I will continue it and repeat lab work today.  Plan: 1. Continue Stribild 2. Check lab work today 3. Followup in 6  months   Cliffton AstersJohn Lorin Hauck, MD [facility] [phone] pager   [phone] cell 06/03/2014, 10:33 AM

## 2014-06-04 LAB — HIV-1 RNA QUANT-NO REFLEX-BLD: HIV 1 RNA Quant: <20 copies/mL (ref <20)

## 2014-06-04 LAB — T-HELPER CELL (CD4) - (RCID CLINIC ONLY)
CD4 T CELL HELPER: 20 % — AB (ref 33–55)
CD4 T Cell Abs: 580 /uL (ref 400–2700)

## 2014-06-05 ENCOUNTER — Other Ambulatory Visit: Payer: Self-pay | Admitting: Internal Medicine

## 2014-06-07 LAB — HLA B*5701: HLA-B*5701 w/rflx HLA-B High: NEGATIVE

## 2014-07-02 ENCOUNTER — Other Ambulatory Visit: Payer: Self-pay | Admitting: Internal Medicine

## 2014-07-02 DIAGNOSIS — J45901 Unspecified asthma with (acute) exacerbation: Secondary | ICD-10-CM

## 2014-07-02 DIAGNOSIS — B20 Human immunodeficiency virus [HIV] disease: Secondary | ICD-10-CM

## 2014-08-12 ENCOUNTER — Other Ambulatory Visit: Payer: Self-pay | Admitting: Internal Medicine

## 2014-09-03 ENCOUNTER — Other Ambulatory Visit: Payer: Self-pay | Admitting: Internal Medicine

## 2014-09-09 ENCOUNTER — Other Ambulatory Visit: Payer: Self-pay | Admitting: Internal Medicine

## 2014-10-10 ENCOUNTER — Other Ambulatory Visit: Payer: Self-pay | Admitting: Internal Medicine

## 2014-11-25 ENCOUNTER — Other Ambulatory Visit: Payer: Self-pay | Admitting: Internal Medicine

## 2014-11-26 NOTE — Telephone Encounter (Signed)
Pt now seeing a Primary Care Practitioner, Darryl LentAmanda Taylor, PA.  Tel#[phone] and fax [phone].  Recommended to [facility] that they contact the pt's PCP for refills for these medications.  [facility] to contact pt's PCP.

## 2014-12-04 ENCOUNTER — Ambulatory Visit (INDEPENDENT_AMBULATORY_CARE_PROVIDER_SITE_OTHER): Payer: Medicaid Other | Admitting: Internal Medicine

## 2014-12-04 ENCOUNTER — Encounter: Payer: Self-pay | Admitting: Internal Medicine

## 2014-12-04 VITALS — BP 150/96 | HR 69 | Temp 98.0°F | Wt 268.0 lb

## 2014-12-04 DIAGNOSIS — B20 Human immunodeficiency virus [HIV] disease: Secondary | ICD-10-CM | POA: Diagnosis not present

## 2014-12-04 DIAGNOSIS — Z79899 Other long term (current) drug therapy: Secondary | ICD-10-CM | POA: Diagnosis present

## 2014-12-04 LAB — LIPID PANEL
CHOL/HDL RATIO: 2.3 ratio
Cholesterol: 139 mg/dL (ref 0–200)
HDL: 61 mg/dL (ref >=46)
LDL Cholesterol: 53 mg/dL (ref 0–99)
TRIGLYCERIDES: 125 mg/dL (ref <150)
VLDL: 25 mg/dL (ref 0–40)

## 2014-12-04 LAB — COMPREHENSIVE METABOLIC PANEL
ALBUMIN: 3.7 g/dL (ref 3.5–5.2)
ALT: 24 U/L (ref 0–35)
AST: 26 U/L (ref 0–37)
Alkaline Phosphatase: 75 U/L (ref 39–117)
BUN: 17 mg/dL (ref 6–23)
CALCIUM: 9.3 mg/dL (ref 8.4–10.5)
CHLORIDE: 101 meq/L (ref 96–112)
CO2: 21 mEq/L (ref 19–32)
Creat: 0.65 mg/dL (ref 0.50–1.10)
Glucose, Bld: 129 mg/dL — ABNORMAL HIGH (ref 70–99)
POTASSIUM: 3.6 meq/L (ref 3.5–5.3)
Sodium: 137 mEq/L (ref 135–145)
Total Bilirubin: 0.3 mg/dL (ref 0.2–1.2)
Total Protein: 7 g/dL (ref 6.0–8.3)

## 2014-12-04 LAB — CBC
HEMATOCRIT: 44.6 % (ref 36.0–46.0)
HEMOGLOBIN: 15.1 g/dL — AB (ref 12.0–15.0)
MCH: 30.4 pg (ref 26.0–34.0)
MCHC: 33.9 g/dL (ref 30.0–36.0)
MCV: 89.7 fL (ref 78.0–100.0)
MPV: 9 fL (ref 8.6–12.4)
Platelets: 372 10*3/uL (ref 150–400)
RBC: 4.97 MIL/uL (ref 3.87–5.11)
RDW: 13.6 % (ref 11.5–15.5)
WBC: 12.1 10*3/uL — ABNORMAL HIGH (ref 4.0–10.5)

## 2014-12-04 MED ORDER — ELVITEG-COBIC-EMTRICIT-TENOFAF 150-150-200-10 MG PO TABS: 1.0000 | ORAL_TABLET | Freq: Every day | ORAL | Status: DC

## 2014-12-04 NOTE — Progress Notes (Signed)
Patient ID: Jenna Watts, female   DOB: 1960/10/11, 54 y.o.   MRN: [id number]          Patient Active Problem List   Diagnosis Date Noted  . HYPERTENSION 10/18/2007    Priority: High  . Human immunodeficiency virus (HIV) disease 10/13/2006    Priority: High  . OBESITY 10/13/2006    Priority: High  . Chronic bronchitis 08/04/2011    Priority: Medium  . UPPER RESPIRATORY INFECTION, ACUTE 08/24/2010  . BREAST PAIN, BILATERAL 04/12/2010  . FOOT PAIN, RIGHT 02/02/2010  . DYSPNEA ON EXERTION 09/03/2009  . CHEST PAIN, INTERMITTENT 09/03/2009  . ASCUS PAP 12/12/2008  . 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 CARPAL TUNNEL SYNDROME, BILATERAL 10/13/2006  . DENTAL CARIES 10/13/2006  . GERD 10/13/2006  . BOILS, RECURRENT 10/13/2006  . ALCOHOL ABUSE, HX OF 10/13/2006  . TOBACCO USE, QUIT 10/13/2006    Patient's Medications  New Prescriptions   ELVITEGRAVIR-COBICISTAT-EMTRICITABINE-TENOFOVIR (GENVOYA) 150-150-200-10 MG TABS TABLET    Take 1 tablet by mouth daily with breakfast.  Previous Medications   GABAPENTIN (NEURONTIN) 600 MG TABLET    Take 600 mg by mouth 2 (two) times daily.   HYDROCHLOROTHIAZIDE (HYDRODIURIL) 25 MG TABLET    TAKE 1 TABLET BY MOUTH DAILY   POTASSIUM CHLORIDE (K-DUR) 10 MEQ TABLET    TAKE 1 TABLET BY MOUTH EVERY DAY   PROPRANOLOL (INDERAL) 10 MG TABLET    Take 10 mg by mouth 2 (two) times daily. Ordered by PCP Jenna LentAmanda Taylor, PA-C   PROVENTIL HFA 108 (90 BASE) MCG/ACT INHALER    INHALE 2 PUFFS BY MOUTH EVERY 6 HOURS AS NEEDED FOR WHEEZING   QVAR 80 MCG/ACT INHALER    INHALE 1 PUFF BY MOUTH TWICE DAILY   VOLTAREN 1 % GEL    APPLY 2 TO 4 GRAMS TO THE AFFECTED AREAS FOUR TIMES A DAY AS NEEDED  Modified Medications   No medications on file  Discontinued Medications   STRIBILD 150-150-200-300 MG TABS TABLET    TAKE 1 TABLET BY MOUTH EVERY DAY WITH BREAKFAST    Subjective: Jenna Watts is in for her routine HIV follow-up visit. She denies missing any doses of her Stribild.  She has been using her pillbox. She takes it each morning with breakfast. She's also been on her blood pressure medications. She states that she has been eating more and walking less in the past year. She is still going to school. She has been unable to [facility] because she cannot afford the co-pay for the visit. She was [facility] seen in [facility] department because of her chronic knee and hip pain. She was given a prescription for meloxicam but states that it does not help very much. [facility] but when she called that office she was told that they could not make an appointment for her.   Review of Systems: Constitutional: positive for fatigue, negative for anorexia, chills, fevers, sweats and weight loss Eyes: negative Ears, nose, mouth, throat, and face: negative Respiratory: negative Cardiovascular: negative Gastrointestinal: negative Genitourinary:negative Musculoskeletal:as noted in history of present illness  No past medical history on file.  History  Substance Use Topics  . Smoking status: Never Smoker   . Smokeless tobacco: Never Used  . Alcohol Use: No    No family history on file.  Allergies  Allergen Reactions  . Blue Dyes (Parenteral)   . Penicillins   . Sulfonamide Derivatives  Objective: Temp: 98 F (36.7 C) (04/21 0925) Temp Source: Oral (04/21 0925) BP: 150/96 mmHg (04/21 0925) Pulse Rate: 69 (04/21 0925) Body mass index is 47.49 kg/(m^2).  General: Her weight is up 38 pounds in the last year and a half to 268 Oral: No oropharyngeal lesions Skin: No rash Lungs: Clear Cor: Regular S1 and S2 with no murmurs Abdomen: Obese, soft and nontender Joints and extremities: No warmth or swelling of her joints. No significant pain with range of motion of her knees. No crepitus. Mood: She is not depressed or anxious  Lab Results Lab Results    Component Value Date   WBC 9.7 11/04/2013   HGB 13.6 11/04/2013   HCT 39.2 11/04/2013   MCV 86.7 11/04/2013   PLT 311 11/04/2013    Lab Results  Component Value Date   CREATININE 0.62 11/04/2013   BUN 9 11/04/2013   NA 139 11/04/2013   K 3.6 11/04/2013   CL 100 11/04/2013   CO2 27 11/04/2013    Lab Results  Component Value Date   ALT 13 11/04/2013   AST 14 11/04/2013   ALKPHOS 74 11/04/2013   BILITOT 0.3 11/04/2013    Lab Results  Component Value Date   CHOL 139 11/04/2013   HDL 70 11/04/2013   LDLCALC 44 11/04/2013   TRIG 127 11/04/2013   CHOLHDL 2.0 11/04/2013    Lab Results HIV 1 RNA QUANT (copies/mL)  Date Value  06/03/2014 <20  11/04/2013 <20  06/12/2013 <20   CD4 T CELL ABS (/uL)  Date Value  06/03/2014 580  11/04/2013 630  06/12/2013 460     Assessment: Her adherence has markedly improved in the past several years and her HIV infection is now under excellent control. I will change her to the new preparation called Genvoya once daily with breakfast.  She has had marked weight gain which is exacerbating her morbid obesity. I talked to her about this is certainly contributing to her joint pain. She states that she will alter her diet and start eating more salads and fruit and start exercising more.  Her blood pressure remains elevated. This is probably exacerbated by her dramatic weight gain.  Plan: 1. Change Stribild to Genvoya 2. Check lab work today 3. Lifestyle modification counseling provided  4. We will see if there is any way she can get assistance with her co-pay so she can see her primary care provider 5. Follow-up in 6 months   Jenna Asters, MD [facility] [phone] pager   [phone] cell 12/04/2014, 9:46 AM

## 2014-12-05 LAB — T-HELPER CELL (CD4) - (RCID CLINIC ONLY)
CD4 % Helper T Cell: 18 % — ABNORMAL LOW (ref 33–55)
CD4 T Cell Abs: 530 /uL (ref 400–2700)

## 2014-12-05 LAB — HIV-1 RNA QUANT-NO REFLEX-BLD: HIV-1 RNA Quant, Log: <1.3 {Log} (ref <1.30)

## 2014-12-05 LAB — RPR

## 2014-12-08 NOTE — Progress Notes (Addendum)
[facility] by [facility] [employer] Part D Medicare.  Received verbal order from Dr. Orvan Falconerampbell to change the pt back to her prior medication, Stribild, at the same dosing and directions.  Called [facility] with this new order.  Left phone message for pt to let her know that she should continue Stribild per Dr. Orvan Falconerampbell..Marland Kitchen

## 2014-12-08 NOTE — Addendum Note (Signed)
Addended by: Jennet MaduroESTRIDGE, Alnisa Hasley D on: 12/08/2014 12:44 PM   Modules accepted: Orders, Medications

## 2014-12-31 ENCOUNTER — Other Ambulatory Visit: Payer: Self-pay | Admitting: Internal Medicine

## 2014-12-31 DIAGNOSIS — M79604 Pain in right leg: Secondary | ICD-10-CM

## 2014-12-31 DIAGNOSIS — J42 Unspecified chronic bronchitis: Secondary | ICD-10-CM

## 2014-12-31 DIAGNOSIS — I10 Essential (primary) hypertension: Secondary | ICD-10-CM

## 2014-12-31 DIAGNOSIS — B20 Human immunodeficiency virus [HIV] disease: Secondary | ICD-10-CM

## 2015-01-01 NOTE — Telephone Encounter (Signed)
Blood Pressure medications and potassium chloride rxes need to go to patient's PCP, Darryl LentAmanda Taylor, PA-C for refills.  Pt should be instructed to call Ms Taylor's office to schedule a follow-up appt for refills.  Thank you.  Ms Taylor's fax number is [phone].  Dr. Orvan Falconerampbell wants the pt seen by her PCP.

## 2015-01-02 ENCOUNTER — Telehealth: Payer: Self-pay | Admitting: Licensed Clinical Social Worker

## 2015-01-02 ENCOUNTER — Telehealth: Payer: Self-pay | Admitting: *Deleted

## 2015-01-02 MED ORDER — ELVITEG-COBIC-EMTRICIT-TENOFAF 150-150-200-10 MG PO TABS: 1.0000 | ORAL_TABLET | Freq: Every day | ORAL | Status: DC

## 2015-01-02 NOTE — Telephone Encounter (Signed)
-----   Message from Cliffton AstersJohn Campbell, MD sent at 01/01/2015  8:36 AM EDT ----- Regarding: RE: [employer] now covering [place] to send to pharmacy? Yes.  John ----- Message -----    From: Troy Sineenise D Zamyiah Tino, RN    Sent: 12/31/2014   4:03 PM      To: Cliffton AstersJohn Campbell, MD Subject: [employer] now covering Darrin LuisGenvoya, OK to send to p#  Pt is up for refill of HIV medication.  [employer] is now authorizing refills for 3 months with a prior approval.  OK to send Genvoya to pharmacy?  Thank you.  Angelique Blonderenise

## 2015-01-02 NOTE — Telephone Encounter (Signed)
I intitiated prior authorization with [employer] and awaiting response. It can take 48 to 72 business day hours. Ref# [id number] ph#[phone]

## 2015-01-23 ENCOUNTER — Other Ambulatory Visit: Payer: Self-pay | Admitting: Internal Medicine

## 2015-01-23 DIAGNOSIS — I10 Essential (primary) hypertension: Secondary | ICD-10-CM

## 2015-01-23 DIAGNOSIS — M25569 Pain in unspecified knee: Secondary | ICD-10-CM

## 2015-01-27 ENCOUNTER — Other Ambulatory Visit: Payer: Self-pay | Admitting: Internal Medicine

## 2015-01-27 DIAGNOSIS — I10 Essential (primary) hypertension: Secondary | ICD-10-CM

## 2015-01-28 ENCOUNTER — Telehealth: Payer: Self-pay | Admitting: *Deleted

## 2015-01-28 NOTE — Telephone Encounter (Signed)
PA received for Qvar rx.  Sent PA electronically for approval.  Response expected in 24-72 business hours.

## 2015-02-02 ENCOUNTER — Telehealth: Payer: Self-pay | Admitting: *Deleted

## 2015-02-02 DIAGNOSIS — J42 Unspecified chronic bronchitis: Secondary | ICD-10-CM

## 2015-02-02 MED ORDER — ALBUTEROL SULFATE HFA 108 (90 BASE) MCG/ACT IN AERS: 2.0000 | INHALATION_SPRAY | Freq: Four times a day (QID) | RESPIRATORY_TRACT | Status: DC | PRN

## 2015-02-02 NOTE — Telephone Encounter (Signed)
PA received for Proventil Inhaler.  Completed PA online through [facility].  May take 48-72 business hours to process and return decision.  Patient's [facility] Medicare plan covers Ventolin inhaler over Proventil inhaler.  [facility].

## 2015-02-02 NOTE — Telephone Encounter (Signed)
Updated PA and sent to pt's [employer] Part D Medicare for approval.

## 2015-02-04 NOTE — Telephone Encounter (Signed)
Approval received through 08-15-2015 from [facility] Part D

## 2015-03-23 ENCOUNTER — Other Ambulatory Visit: Payer: Self-pay | Admitting: Internal Medicine

## 2015-04-30 ENCOUNTER — Other Ambulatory Visit: Payer: Self-pay | Admitting: Internal Medicine

## 2015-05-26 ENCOUNTER — Other Ambulatory Visit: Payer: Self-pay | Admitting: *Deleted

## 2015-05-26 ENCOUNTER — Other Ambulatory Visit: Payer: Medicaid Other

## 2015-05-26 DIAGNOSIS — B20 Human immunodeficiency virus [HIV] disease: Secondary | ICD-10-CM

## 2015-05-28 ENCOUNTER — Telehealth: Payer: Self-pay | Admitting: *Deleted

## 2015-05-28 NOTE — Telephone Encounter (Signed)
Pt requesting a "note" to take to school because she is missing class due to mobility issues.  Pt requesting a seated/rolling walker for her difficulty walking.  RN spoke with the pt about her concerns.  RN reminded the pt that these concerns are not directly related to her HIV and that [place] would be able to help her with this issues.  The pt stated that she cannot afford the $3 co-pay when she goes to the PCP.  She stated that she has been to [facility] ED [facility] for several problems.  The patient does have an upcoming appointment with Dr. Orvan Falconerampbell on Oct. 25.  RN advised pt to discuss her requests with Dr. Orvan Falconerampbell at this appointment.  Pt stated that she would.

## 2015-06-02 ENCOUNTER — Other Ambulatory Visit: Payer: Self-pay | Admitting: Internal Medicine

## 2015-06-03 ENCOUNTER — Other Ambulatory Visit: Payer: Self-pay | Admitting: Internal Medicine

## 2015-06-09 ENCOUNTER — Ambulatory Visit (INDEPENDENT_AMBULATORY_CARE_PROVIDER_SITE_OTHER): Payer: Medicare Other | Admitting: Internal Medicine

## 2015-06-09 ENCOUNTER — Encounter: Payer: Self-pay | Admitting: Internal Medicine

## 2015-06-09 VITALS — BP 164/109 | HR 69 | Temp 97.4°F | Ht 63.0 in | Wt 289.0 lb

## 2015-06-09 DIAGNOSIS — B20 Human immunodeficiency virus [HIV] disease: Secondary | ICD-10-CM

## 2015-06-09 DIAGNOSIS — Z23 Encounter for immunization: Secondary | ICD-10-CM | POA: Diagnosis not present

## 2015-06-09 NOTE — Assessment & Plan Note (Signed)
I will need to repeat her blood work today but it sounds like her adherence with Darrin LuisGenvoya is excellent. Her infection has been under good control over the past 2 years. I will see her back in 6 months. I have encouraged her to set up a follow-up visit with her primary care physician.

## 2015-06-09 NOTE — Progress Notes (Signed)
Patient ID: Jenna Watts, female   DOB: [birth year], 54 y.o.   MRN: [id number]          Patient Active Problem List   Diagnosis Date Noted  . HYPERTENSION 10/18/2007    Priority: High  . Human immunodeficiency virus (HIV) disease (HCC) 10/13/2006    Priority: High  . OBESITY 10/13/2006    Priority: High  . Chronic bronchitis 08/04/2011    Priority: Medium  . UPPER RESPIRATORY INFECTION, ACUTE 08/24/2010  . BREAST PAIN, BILATERAL 04/12/2010  . FOOT PAIN, RIGHT 02/02/2010  . DYSPNEA ON EXERTION 09/03/2009  . CHEST PAIN, INTERMITTENT 09/03/2009  . ASCUS PAP 12/12/2008  . 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 CARPAL TUNNEL SYNDROME, BILATERAL 10/13/2006  . DENTAL CARIES 10/13/2006  . GERD 10/13/2006  . BOILS, RECURRENT 10/13/2006  . ALCOHOL ABUSE, HX OF 10/13/2006  . TOBACCO USE, QUIT 10/13/2006    Patient's Medications  New Prescriptions   No medications on file  Previous Medications   ALBUTEROL (VENTOLIN HFA) 108 (90 BASE) MCG/ACT INHALER    Inhale 2 puffs into the lungs every 6 (six) hours as needed for wheezing or shortness of breath.   ELVITEGRAVIR-COBICISTAT-EMTRICITABINE-TENOFOVIR (GENVOYA) 150-150-200-10 MG TABS TABLET    Take 1 tablet by mouth daily with breakfast.   GABAPENTIN (NEURONTIN) 300 MG CAPSULE    TAKE 2 CAPSULES BY MOUTH TWICE DAILY   HYDROCHLOROTHIAZIDE (HYDRODIURIL) 25 MG TABLET    TAKE 1 TABLET BY MOUTH DAILY   POTASSIUM CHLORIDE (K-DUR) 10 MEQ TABLET    TAKE 1 TABLET BY MOUTH EVERY DAY   POTASSIUM CHLORIDE SA (K-DUR,KLOR-CON) 20 MEQ TABLET    TAKE 1 TABLET BY MOUTH EVERY DAY   PROPRANOLOL (INDERAL) 10 MG TABLET    TAKE 1 TABLET BY MOUTH TWICE DAILY   QVAR 80 MCG/ACT INHALER    INHALE 1 PUFF BY MOUTH TWICE DAILY   VOLTAREN 1 % GEL    APPLY 2 TO 4 GRAMS USING DOSING SHEET TO AFFECTED AREA 4 TIMES PER DAY AS DIRECTED *MAX 32 GRAMS/DAY.  Modified Medications   No medications on file  Discontinued Medications   No medications on file     Subjective: Jenna Watts is in for her routine HIV follow-up visit. She denies missing any doses of her Genvoya. She takes it each morning with breakfast. She states that she is having more problems with knee pain and swelling in her legs. She has not seen her primary care physician, Dr. Ladona Ridgel, this year. In the past she had said that she could not afford her $3 co-pay but she says that this should not be a problem now. She has not had any problems with fever, chills or sweats.   Review of Systems: Pertinent items are noted in HPI.  No past medical history on file.  Social History  Substance Use Topics  . Smoking status: Never Smoker   . Smokeless tobacco: Never Used  . Alcohol Use: No    No family history on file.  Allergies  Allergen Reactions  . Blue Dyes (Parenteral)   . Penicillins   . Sulfonamide Derivatives     Objective:  Filed Vitals:   06/09/15 0938  BP: 164/109  Pulse: 69  Temp: 97.4 F (36.3 C)  TempSrc: Oral  Height:  (1.6 m)  Weight: 289 lb (131.09 kg)   Body mass index is 51.21 kg/(m^2).  General: Her weight is up 21 pounds since her last visit Oral: No oropharyngeal lesions Skin: No rash Lungs: Clear Cor: Regular  S1 and S2 with no murmurs Joints and extremities: No redness, swelling or warmth of her knees. No pedal edema Mood: Normal she does not appear anxious or depressed  Lab Results Lab Results  Component Value Date   WBC 12.1* 12/04/2014   HGB 15.1* 12/04/2014   HCT 44.6 12/04/2014   MCV 89.7 12/04/2014   PLT 372 12/04/2014    Lab Results  Component Value Date   CREATININE 0.65 12/04/2014   BUN 17 12/04/2014   NA 137 12/04/2014   K 3.6 12/04/2014   CL 101 12/04/2014   CO2 21 12/04/2014    Lab Results  Component Value Date   ALT 24 12/04/2014   AST 26 12/04/2014   ALKPHOS 75 12/04/2014   BILITOT 0.3 12/04/2014    Lab Results  Component Value Date   CHOL 139 12/04/2014   HDL 61 12/04/2014   LDLCALC 53 12/04/2014    TRIG 125 12/04/2014   CHOLHDL 2.3 12/04/2014    Lab Results HIV 1 RNA QUANT (copies/mL)  Date Value  12/04/2014 <20  06/03/2014 <20  11/04/2013 <20   CD4 T CELL ABS (/uL)  Date Value  12/04/2014 530  06/03/2014 580  11/04/2013 630     Problem List Items Addressed This Visit      High   Human immunodeficiency virus (HIV) disease (HCC)    I will need to repeat her blood work today but it sounds like her adherence with Genvoya is excellent. Her infection has been under good control over the past 2 years. I will see her back in 6 months. I have encouraged her to set up a follow-up visit with her primary care physician.      Relevant Orders   T-helper cell (CD4)- ([facility] clinic only)   HIV 1 RNA quant-no reflex-bld        Cliffton AstersJohn Kanaan Kagawa, MD [facility] [phone] pager   [phone] cell 06/09/2015, 9:58 AM

## 2015-06-10 LAB — HIV-1 RNA QUANT-NO REFLEX-BLD: HIV-1 RNA Quant, Log: <1.3 Log copies/mL (ref <1.30)

## 2015-06-11 LAB — T-HELPER CELL (CD4) - (RCID CLINIC ONLY)
CD4 % Helper T Cell: 24 % — ABNORMAL LOW (ref 33–55)
CD4 T Cell Abs: 540 /uL (ref 400–2700)

## 2015-06-12 ENCOUNTER — Telehealth: Payer: Self-pay | Admitting: *Deleted

## 2015-06-12 DIAGNOSIS — I10 Essential (primary) hypertension: Secondary | ICD-10-CM

## 2015-06-12 NOTE — Telephone Encounter (Signed)
Patient left message in triage, stating she "didn't get her fluid pill and is swole up like a butterball."  Patient has low literacy, is unable to advise which pill she is missing.  Per chart review, HCTZ 25mg , potassium 20meq, and propanolol 10mg  were not refilled at last request 10/18.  She has an appointment in November to re-establish care with her PCP, Dr. Darryl LentAmanda Taylor.  Please advise if ok to fill in the mean time. Andree CossHowell, Michelle M, RN

## 2015-06-15 ENCOUNTER — Other Ambulatory Visit: Payer: Self-pay | Admitting: *Deleted

## 2015-06-15 DIAGNOSIS — I10 Essential (primary) hypertension: Secondary | ICD-10-CM

## 2015-06-15 MED ORDER — POTASSIUM CHLORIDE ER 10 MEQ PO TBCR: 10.0000 meq | EXTENDED_RELEASE_TABLET | Freq: Every day | ORAL | Status: DC

## 2015-06-15 MED ORDER — PROPRANOLOL HCL 10 MG PO TABS: 10.0000 mg | ORAL_TABLET | Freq: Two times a day (BID) | ORAL | Status: DC

## 2015-06-15 MED ORDER — HYDROCHLOROTHIAZIDE 25 MG PO TABS: 25.0000 mg | ORAL_TABLET | Freq: Every day | ORAL | Status: DC

## 2015-06-15 NOTE — Telephone Encounter (Signed)
Please give her one refill of hydrochlorothiazide, potassium and propranolol.

## 2015-06-15 NOTE — Telephone Encounter (Signed)
Done. Thank you.

## 2015-06-24 ENCOUNTER — Other Ambulatory Visit: Payer: Self-pay | Admitting: Internal Medicine

## 2015-06-29 ENCOUNTER — Other Ambulatory Visit: Payer: Self-pay

## 2015-06-29 DIAGNOSIS — Z1231 Encounter for screening mammogram for malignant neoplasm of breast: Secondary | ICD-10-CM

## 2015-07-23 ENCOUNTER — Ambulatory Visit: Payer: Medicare Other

## 2015-07-28 ENCOUNTER — Other Ambulatory Visit: Payer: Self-pay | Admitting: Internal Medicine

## 2015-07-30 ENCOUNTER — Other Ambulatory Visit: Payer: Self-pay | Admitting: Internal Medicine

## 2015-07-30 DIAGNOSIS — J410 Simple chronic bronchitis: Secondary | ICD-10-CM

## 2015-08-20 ENCOUNTER — Other Ambulatory Visit: Payer: Self-pay | Admitting: Internal Medicine

## 2015-08-20 DIAGNOSIS — I10 Essential (primary) hypertension: Secondary | ICD-10-CM

## 2015-08-27 ENCOUNTER — Other Ambulatory Visit: Payer: Self-pay | Admitting: Internal Medicine

## 2015-08-27 DIAGNOSIS — M25579 Pain in unspecified ankle and joints of unspecified foot: Secondary | ICD-10-CM

## 2015-10-06 ENCOUNTER — Telehealth: Payer: Self-pay | Admitting: *Deleted

## 2015-10-06 NOTE — Telephone Encounter (Signed)
PA required for Qvar inhaler, completed in CoverMyMeds.   Insurance does cover Flovent HFA and [employer].

## 2015-11-17 ENCOUNTER — Other Ambulatory Visit: Payer: Medicare Other

## 2015-12-01 ENCOUNTER — Other Ambulatory Visit: Payer: Self-pay | Admitting: Internal Medicine

## 2015-12-01 ENCOUNTER — Ambulatory Visit: Payer: Medicare Other | Admitting: Internal Medicine

## 2015-12-01 DIAGNOSIS — R0602 Shortness of breath: Secondary | ICD-10-CM

## 2015-12-01 DIAGNOSIS — B20 Human immunodeficiency virus [HIV] disease: Secondary | ICD-10-CM

## 2015-12-29 ENCOUNTER — Other Ambulatory Visit: Payer: Self-pay | Admitting: Internal Medicine

## 2015-12-29 DIAGNOSIS — J4531 Mild persistent asthma with (acute) exacerbation: Secondary | ICD-10-CM

## 2016-01-26 ENCOUNTER — Other Ambulatory Visit: Payer: Self-pay | Admitting: Internal Medicine

## 2016-01-28 ENCOUNTER — Ambulatory Visit: Payer: Medicare Other | Admitting: Internal Medicine

## 2016-03-03 ENCOUNTER — Ambulatory Visit: Payer: Medicare Other | Admitting: Internal Medicine

## 2016-03-22 ENCOUNTER — Ambulatory Visit: Payer: Medicare Other | Admitting: Internal Medicine

## 2016-04-01 ENCOUNTER — Other Ambulatory Visit: Payer: Self-pay | Admitting: Internal Medicine

## 2016-04-01 DIAGNOSIS — M25579 Pain in unspecified ankle and joints of unspecified foot: Secondary | ICD-10-CM

## 2016-04-19 ENCOUNTER — Encounter: Payer: Self-pay | Admitting: Internal Medicine

## 2016-04-19 ENCOUNTER — Ambulatory Visit (INDEPENDENT_AMBULATORY_CARE_PROVIDER_SITE_OTHER): Payer: Medicare Other | Admitting: Internal Medicine

## 2016-04-19 DIAGNOSIS — Z23 Encounter for immunization: Secondary | ICD-10-CM | POA: Diagnosis not present

## 2016-04-19 DIAGNOSIS — M25579 Pain in unspecified ankle and joints of unspecified foot: Secondary | ICD-10-CM | POA: Diagnosis not present

## 2016-04-19 DIAGNOSIS — B20 Human immunodeficiency virus [HIV] disease: Secondary | ICD-10-CM | POA: Diagnosis present

## 2016-04-19 LAB — COMPREHENSIVE METABOLIC PANEL
ALK PHOS: 92 U/L (ref 33–130)
ALT: 43 U/L — AB (ref 6–29)
AST: 32 U/L (ref 10–35)
Albumin: 4.1 g/dL (ref 3.6–5.1)
BILIRUBIN TOTAL: 0.5 mg/dL (ref 0.2–1.2)
BUN: 18 mg/dL (ref 7–25)
CO2: 31 mmol/L (ref 20–31)
Calcium: 9.8 mg/dL (ref 8.6–10.4)
Chloride: 102 mmol/L (ref 98–110)
Creat: 0.8 mg/dL (ref 0.50–1.05)
Glucose, Bld: 194 mg/dL — ABNORMAL HIGH (ref 65–99)
POTASSIUM: 4.4 mmol/L (ref 3.5–5.3)
Sodium: 142 mmol/L (ref 135–146)
TOTAL PROTEIN: 7.5 g/dL (ref 6.1–8.1)

## 2016-04-19 LAB — CBC
HEMATOCRIT: 45.9 % — AB (ref 35.0–45.0)
Hemoglobin: 15.2 g/dL (ref 11.7–15.5)
MCH: 30.2 pg (ref 27.0–33.0)
MCHC: 33.1 g/dL (ref 32.0–36.0)
MCV: 91.1 fL (ref 80.0–100.0)
MPV: 9.5 fL (ref 7.5–12.5)
PLATELETS: 269 10*3/uL (ref 140–400)
RBC: 5.04 MIL/uL (ref 3.80–5.10)
RDW: 14.3 % (ref 11.0–15.0)
WBC: 8 10*3/uL (ref 3.8–10.8)

## 2016-04-19 MED ORDER — DICLOFENAC SODIUM 1 % TD GEL: 2.0000 g | Freq: Four times a day (QID) | TRANSDERMAL | 0 refills | Status: DC

## 2016-04-19 NOTE — Progress Notes (Signed)
Patient Active Problem List   Diagnosis Date Noted   HYPERTENSION 10/18/2007    Priority: High   Human immunodeficiency virus (HIV) disease (HCC) 10/13/2006    Priority: High   OBESITY 10/13/2006    Priority: High   Chronic bronchitis 08/04/2011    Priority: Medium   UPPER RESPIRATORY INFECTION, ACUTE 08/24/2010   BREAST PAIN, BILATERAL 04/12/2010   FOOT PAIN, RIGHT 02/02/2010   DYSPNEA ON EXERTION 09/03/2009   CHEST PAIN, INTERMITTENT 09/03/2009   ASCUS PAP 12/12/2008   MENTAL RETARDATION 10/13/2006   CARPAL TUNNEL SYNDROME, BILATERAL 10/13/2006   DENTAL CARIES 10/13/2006   GERD 10/13/2006   BOILS, RECURRENT 10/13/2006   ALCOHOL ABUSE, HX OF 10/13/2006   TOBACCO USE, QUIT 10/13/2006      Subjective: Jenna Watts is in for her routine HIV follow-up visit. She has all of her medications with her. She takes her Genvoya every morning with breakfast. She denies missing any doses. She has not seen her primary care physician in quite some time.   Review of Systems: Review of Systems  Constitutional: Positive for malaise/fatigue. Negative for chills, diaphoresis, fever and weight loss.  HENT: Negative for sore throat.   Respiratory: Positive for cough. Negative for sputum production and shortness of breath.   Cardiovascular: Negative for chest pain.  Gastrointestinal: Negative for diarrhea, nausea and vomiting.  Genitourinary: Negative for dysuria and frequency.  Musculoskeletal: Positive for joint pain. Negative for myalgias.  Skin: Negative for rash.  Neurological: Negative for dizziness and headaches.  Psychiatric/Behavioral: Negative for depression and substance abuse. The patient is not nervous/anxious.     No past medical history on file.  Social History  Substance Use Topics   Smoking status: Never Smoker   Smokeless tobacco: Never Used   Alcohol use No    No family history on file.  Allergies  Allergen Reactions   Blue Dyes (Parenteral)     Penicillins    Sulfonamide Derivatives     Objective:  Vitals:   04/19/16 0829  BP: 124/88  Pulse: 79  Temp: 98.7 F (37.1 C)  TempSrc: Oral  Weight: 287 lb 8 oz (130.4 kg)   Body mass index is 50.93 kg/m.  Physical Exam  Constitutional: She is oriented to person, place, and time.  She has gained nearly 50 pounds in the last 3 years.  HENT:  Mouth/Throat: No oropharyngeal exudate.  Eyes: Conjunctivae are normal.  Cardiovascular: Normal rate and regular rhythm.   No murmur heard. Pulmonary/Chest: Effort normal and breath sounds normal.  Abdominal: Soft. She exhibits no mass. There is no tenderness.  Musculoskeletal: Normal range of motion. She exhibits no edema or tenderness.  Neurological: She is alert and oriented to person, place, and time.  Skin: No rash noted.  Psychiatric: Mood and affect normal.    Lab Results Lab Results  Component Value Date   WBC 12.1 (H) 12/04/2014   HGB 15.1 (H) 12/04/2014   HCT 44.6 12/04/2014   MCV 89.7 12/04/2014   PLT 372 12/04/2014    Lab Results  Component Value Date   CREATININE 0.65 12/04/2014   BUN 17 12/04/2014   NA 137 12/04/2014   K 3.6 12/04/2014   CL 101 12/04/2014   CO2 21 12/04/2014    Lab Results  Component Value Date   ALT 24 12/04/2014   AST 26 12/04/2014   ALKPHOS 75 12/04/2014   BILITOT 0.3 12/04/2014    Lab Results  Component Value Date  CHOL 139 12/04/2014   HDL 61 12/04/2014   LDLCALC 53 12/04/2014   TRIG 125 12/04/2014   CHOLHDL 2.3 12/04/2014   HIV 1 RNA Quant (copies/mL)  Date Value  06/09/2015 <20  12/04/2014 <20  06/03/2014 <20   CD4 T Cell Abs (/uL)  Date Value  06/09/2015 540  12/04/2014 530  06/03/2014 580     Problem List Items Addressed This Visit       High   Human immunodeficiency virus (HIV) disease (HCC)    Her adherence remains excellent and as a result her infection is under excellent control. She will continue Genvoya and get blood work today. She will follow-up  in 6 months. I encouraged her to make an appointment to see her primary care provider. I also talked to her about the importance of losing weight. She states that she wants to start walking again.      Relevant Orders   T-helper cell (CD4)- ([facility] clinic only)   HIV 1 RNA quant-no reflex-bld   CBC   Comprehensive metabolic panel   RPR      Other Visit Diagnoses     Pain in joint, ankle and foot, unspecified laterality             Cliffton AstersJohn Abbott Jasinski, MD [facility] [phone] pager   [phone] cell 04/19/2016, 9:30 AM

## 2016-04-19 NOTE — Assessment & Plan Note (Signed)
Her adherence remains excellent and as a result her infection is under excellent control. She will continue Genvoya and get blood work today. She will follow-up in 6 months. I encouraged her to make an appointment to see her primary care provider. I also talked to her about the importance of losing weight. She states that she wants to start walking again.

## 2016-04-20 LAB — T-HELPER CELL (CD4) - (RCID CLINIC ONLY)
CD4 % Helper T Cell: 21 % — ABNORMAL LOW (ref 33–55)
CD4 T Cell Abs: 480 /uL (ref 400–2700)

## 2016-04-20 LAB — RPR

## 2016-04-21 LAB — HIV-1 RNA QUANT-NO REFLEX-BLD
HIV 1 RNA Quant: <20 copies/mL (ref <20)
HIV-1 RNA Quant, Log: <1.3 Log copies/mL (ref <1.30)

## 2016-05-24 ENCOUNTER — Other Ambulatory Visit: Payer: Self-pay | Admitting: Internal Medicine

## 2016-05-24 DIAGNOSIS — M25579 Pain in unspecified ankle and joints of unspecified foot: Secondary | ICD-10-CM

## 2016-05-24 DIAGNOSIS — R0602 Shortness of breath: Secondary | ICD-10-CM

## 2016-05-24 DIAGNOSIS — B20 Human immunodeficiency virus [HIV] disease: Secondary | ICD-10-CM

## 2016-06-28 ENCOUNTER — Other Ambulatory Visit: Payer: Self-pay | Admitting: Internal Medicine

## 2016-06-29 MED ORDER — GABAPENTIN 300 MG PO CAPS: 600.0000 mg | ORAL_CAPSULE | Freq: Two times a day (BID) | ORAL | 0 refills | Status: DC

## 2016-06-29 MED ORDER — PROPRANOLOL HCL 10 MG PO TABS: 10.0000 mg | ORAL_TABLET | Freq: Two times a day (BID) | ORAL | 0 refills | Status: DC

## 2016-06-29 MED ORDER — HYDROCHLOROTHIAZIDE 25 MG PO TABS: 25.0000 mg | ORAL_TABLET | Freq: Every day | ORAL | 0 refills | Status: DC

## 2016-06-29 MED ORDER — POTASSIUM CHLORIDE CRYS ER 10 MEQ PO TBCR: 10.0000 meq | EXTENDED_RELEASE_TABLET | Freq: Every day | ORAL | 0 refills | Status: DC

## 2016-06-29 NOTE — Telephone Encounter (Signed)
Message to to [facility].  Patient needs to make an appointment with her PCP, Darryl LentAmanda Taylor, PA-C as soon as possible to manage this medication.  Phone: [phone]; Fax: [phone].  Please send future refill requests to this provider.  Dr. Orvan Falconerampbell only manages the patient HIV medications.

## 2016-06-29 NOTE — Addendum Note (Signed)
Addended by: Jennet MaduroESTRIDGE, Rayelle Armor D on: 06/29/2016 12:16 PM   Modules accepted: Orders

## 2016-07-27 ENCOUNTER — Other Ambulatory Visit: Payer: Self-pay | Admitting: Internal Medicine

## 2016-07-27 DIAGNOSIS — G629 Polyneuropathy, unspecified: Secondary | ICD-10-CM

## 2016-07-27 DIAGNOSIS — J4531 Mild persistent asthma with (acute) exacerbation: Secondary | ICD-10-CM

## 2016-08-23 ENCOUNTER — Other Ambulatory Visit: Payer: Self-pay | Admitting: Internal Medicine

## 2016-08-23 DIAGNOSIS — M25579 Pain in unspecified ankle and joints of unspecified foot: Secondary | ICD-10-CM

## 2016-08-23 DIAGNOSIS — J4531 Mild persistent asthma with (acute) exacerbation: Secondary | ICD-10-CM

## 2016-08-29 ENCOUNTER — Telehealth: Payer: Self-pay | Admitting: *Deleted

## 2016-08-29 ENCOUNTER — Other Ambulatory Visit: Payer: Self-pay | Admitting: *Deleted

## 2016-08-29 MED ORDER — HYDROCHLOROTHIAZIDE 25 MG PO TABS: 25.0000 mg | ORAL_TABLET | Freq: Every day | ORAL | 0 refills | Status: DC

## 2016-08-29 NOTE — Telephone Encounter (Signed)
Patient called for a refill on the HCTZ. Advised she needed an appt with her PCP. I refilled this med for one month and scheduled her an appt with PCP for 09/26/16 at 11:15 AM. Jenna MolaJacqueline Cockerham

## 2016-09-19 ENCOUNTER — Other Ambulatory Visit: Payer: Self-pay | Admitting: Internal Medicine

## 2016-09-19 DIAGNOSIS — R0602 Shortness of breath: Secondary | ICD-10-CM

## 2016-09-19 DIAGNOSIS — M25579 Pain in unspecified ankle and joints of unspecified foot: Secondary | ICD-10-CM

## 2016-09-19 DIAGNOSIS — J4531 Mild persistent asthma with (acute) exacerbation: Secondary | ICD-10-CM

## 2016-09-20 ENCOUNTER — Other Ambulatory Visit: Payer: Self-pay | Admitting: Internal Medicine

## 2016-09-21 ENCOUNTER — Telehealth: Payer: Self-pay | Admitting: *Deleted

## 2016-09-21 NOTE — Telephone Encounter (Signed)
Refill request from pharmacy for hctz. Patient had to cancel her PCP appt for 09/26/16 because she did not have copay. She did reschedule it for 10/24/16. Advised her to please keep this appt and she said she would. Refilled x 2 until she get in with PCP.

## 2016-10-11 ENCOUNTER — Other Ambulatory Visit: Payer: Self-pay | Admitting: Pharmacist

## 2016-10-11 DIAGNOSIS — J410 Simple chronic bronchitis: Secondary | ICD-10-CM

## 2016-10-11 MED ORDER — BECLOMETHASONE DIPROP HFA 80 MCG/ACT IN AERB: 1.0000 | INHALATION_SPRAY | Freq: Two times a day (BID) | RESPIRATORY_TRACT | 5 refills | Status: DC

## 2016-10-18 ENCOUNTER — Ambulatory Visit: Payer: Medicare Other | Admitting: Internal Medicine

## 2016-10-26 ENCOUNTER — Other Ambulatory Visit: Payer: Self-pay | Admitting: Internal Medicine

## 2016-10-26 DIAGNOSIS — M25579 Pain in unspecified ankle and joints of unspecified foot: Secondary | ICD-10-CM

## 2016-10-27 ENCOUNTER — Other Ambulatory Visit: Payer: Self-pay | Admitting: Internal Medicine

## 2016-10-27 DIAGNOSIS — R0602 Shortness of breath: Secondary | ICD-10-CM

## 2016-11-01 ENCOUNTER — Ambulatory Visit: Payer: Medicare Other | Admitting: Internal Medicine

## 2016-11-29 ENCOUNTER — Ambulatory Visit: Payer: Medicare Other | Admitting: Internal Medicine

## 2016-11-29 ENCOUNTER — Telehealth: Payer: Self-pay

## 2016-11-29 ENCOUNTER — Other Ambulatory Visit: Payer: Self-pay | Admitting: Internal Medicine

## 2016-11-29 DIAGNOSIS — M25579 Pain in unspecified ankle and joints of unspecified foot: Secondary | ICD-10-CM

## 2016-11-29 DIAGNOSIS — R0602 Shortness of breath: Secondary | ICD-10-CM

## 2016-11-30 NOTE — Telephone Encounter (Signed)
Error

## 2016-12-09 ENCOUNTER — Telehealth: Payer: Self-pay | Admitting: *Deleted

## 2016-12-09 DIAGNOSIS — J410 Simple chronic bronchitis: Secondary | ICD-10-CM

## 2016-12-09 NOTE — Telephone Encounter (Signed)
PA approved through 08/14/17 by [facility] [employer] [facility].  Notified pharmacy

## 2016-12-09 NOTE — Telephone Encounter (Signed)
Needs PA for Qvar redihaler through[employer] Medicare.  PA completed and sent to [facility] for review via CoverMyMeds.  Pending notification - 24-72 hours.

## 2016-12-27 ENCOUNTER — Ambulatory Visit: Payer: Medicare Other | Admitting: Internal Medicine

## 2017-02-28 ENCOUNTER — Ambulatory Visit (INDEPENDENT_AMBULATORY_CARE_PROVIDER_SITE_OTHER): Payer: Medicare Other | Admitting: Internal Medicine

## 2017-02-28 ENCOUNTER — Encounter: Payer: Self-pay | Admitting: Internal Medicine

## 2017-02-28 VITALS — BP 151/105 | HR 81 | Temp 98.0°F | Ht 62.0 in | Wt 258.0 lb

## 2017-02-28 DIAGNOSIS — B20 Human immunodeficiency virus [HIV] disease: Secondary | ICD-10-CM

## 2017-02-28 DIAGNOSIS — G629 Polyneuropathy, unspecified: Secondary | ICD-10-CM

## 2017-02-28 DIAGNOSIS — I10 Essential (primary) hypertension: Secondary | ICD-10-CM | POA: Diagnosis present

## 2017-02-28 LAB — CBC
HCT: 44.8 % (ref 35.0–45.0)
Hemoglobin: 14.5 g/dL (ref 11.7–15.5)
MCH: 28.3 pg (ref 27.0–33.0)
MCHC: 32.4 g/dL (ref 32.0–36.0)
MCV: 87.3 fL (ref 80.0–100.0)
MPV: 9 fL (ref 7.5–12.5)
Platelets: 218 10*3/uL (ref 140–400)
RBC: 5.13 MIL/uL — ABNORMAL HIGH (ref 3.80–5.10)
RDW: 14.4 % (ref 11.0–15.0)
WBC: 10 10*3/uL (ref 3.8–10.8)

## 2017-02-28 LAB — COMPREHENSIVE METABOLIC PANEL
ALBUMIN: 3.3 g/dL — AB (ref 3.6–5.1)
ALT: 49 U/L — ABNORMAL HIGH (ref 6–29)
AST: 89 U/L — ABNORMAL HIGH (ref 10–35)
Alkaline Phosphatase: 74 U/L (ref 33–130)
BUN: 8 mg/dL (ref 7–25)
CHLORIDE: 98 mmol/L (ref 98–110)
CO2: 26 mmol/L (ref 20–31)
CREATININE: 0.54 mg/dL (ref 0.50–1.05)
Calcium: 8.8 mg/dL (ref 8.6–10.4)
Glucose, Bld: 110 mg/dL — ABNORMAL HIGH (ref 65–99)
Potassium: 3.8 mmol/L (ref 3.5–5.3)
SODIUM: 134 mmol/L — AB (ref 135–146)
Total Bilirubin: 0.5 mg/dL (ref 0.2–1.2)
Total Protein: 7.6 g/dL (ref 6.1–8.1)

## 2017-02-28 MED ORDER — HYDROCHLOROTHIAZIDE 25 MG PO TABS: 25.0000 mg | ORAL_TABLET | Freq: Every day | ORAL | 1 refills | Status: DC

## 2017-02-28 MED ORDER — ELVITEG-COBIC-EMTRICIT-TENOFAF 150-150-200-10 MG PO TABS: 1.0000 | ORAL_TABLET | Freq: Every day | ORAL | 5 refills | Status: DC

## 2017-02-28 MED ORDER — GABAPENTIN 300 MG PO CAPS: ORAL_CAPSULE | ORAL | 5 refills | Status: DC

## 2017-02-28 MED FILL — HYDROCHLOROTHIAZIDE 25 MG T: 25 | 30 days supply | Qty: 30 | Fill #0

## 2017-02-28 MED FILL — GENVOYA TABLET: 150-150-200 | 30 days supply | Qty: 30 | Fill #0

## 2017-02-28 MED FILL — GABAPENTIN 300 MG CAPSULE: 300 | 30 days supply | Qty: 120 | Fill #0

## 2017-02-28 NOTE — Progress Notes (Signed)
HPI: Jenna Watts is a 56 y.o. female who presents to the [facility] today to follow-up with Dr. Orvan Falconerampbell.  Allergies: Allergies  Allergen Reactions  . Blue Dyes (Parenteral)   . Penicillins   . Sulfonamide Derivatives     Past Medical History: No past medical history on file.  Social History: Social History   Social History  . Marital status: Widowed    Spouse name: N/A  . Number of children: N/A  . Years of education: N/A   Social History Main Topics  . Smoking status: Never Smoker  . Smokeless tobacco: Never Used  . Alcohol use No  . Drug use: No  . Sexual activity: Not Currently    Partners: Male     Comment: given condoms   Other Topics Concern  . None   Social History Narrative  . None    Current Regimen: Genvoya  Labs: HIV 1 RNA Quant (copies/mL)  Date Value  04/19/2016 <20  06/09/2015 <20  12/04/2014 <20   CD4 T Cell Abs (/uL)  Date Value  04/19/2016 480  06/09/2015 540  12/04/2014 530   Hep B S Ab (no units)  Date Value  10/09/2006 No   Hepatitis B Surface Ag (no units)  Date Value  10/09/2006 No   HCV Ab (no units)  Date Value  10/09/2006 No    CrCl: CrCl cannot be calculated (Patient's most recent lab result is older than the maximum 21 days allowed.).  Lipids:    Component Value Date/Time   CHOL 139 12/04/2014 1030   TRIG 125 12/04/2014 1030   HDL 61 12/04/2014 1030   CHOLHDL 2.3 12/04/2014 1030   VLDL 25 12/04/2014 1030   LDLCALC 53 12/04/2014 1030    Assessment: Jenna Watts is here today to see Dr. Orvan Falconerampbell after not being seen here since September 2017. She was supposed to be on Genvoya but tells me that she has been out of it since last September. [facility] and she can fill it there with $0 copay. She wants it mailed, so we will mail it to her.  Spent some time discussing the importance of taking her medications and letting us know when she is out/needs refills or has any problems.  I will make a f/u  apt with me for 3-4 weeks.  Told her to bring all of her medications with her and her pill box at that time. I will get labs including a BMET when she follows up with me to make sure she doesn't need the potassium supplementation anymore.  Plans: - Restart Genvoya PO once daily - [facility] - F/u with me 8/22 at 9am  Cassie L. Kuppelweiser, PharmD, CPP Infectious Diseases Clinical Pharmacist [facility] 02/28/2017, 12:04 PM

## 2017-02-28 NOTE — Assessment & Plan Note (Signed)
I will check her blood work today and see if we can get her started back on her medication right away. She will return in 6 weeks.
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
## 2017-03-01 LAB — RPR

## 2017-03-02 LAB — HIV-1 RNA QUANT-NO REFLEX-BLD
HIV 1 RNA Quant: 31700 copies/mL — ABNORMAL HIGH
HIV-1 RNA Quant, Log: 4.5 Log copies/mL — ABNORMAL HIGH

## 2017-03-02 LAB — T-HELPER CELL (CD4) - (RCID CLINIC ONLY)
CD4 % Helper T Cell: 8 % — ABNORMAL LOW (ref 33–55)
CD4 T Cell Abs: 430 /uL (ref 400–2700)

## 2017-03-06 ENCOUNTER — Other Ambulatory Visit: Payer: Self-pay | Admitting: Internal Medicine

## 2017-03-06 DIAGNOSIS — R0602 Shortness of breath: Secondary | ICD-10-CM

## 2017-03-06 DIAGNOSIS — J410 Simple chronic bronchitis: Secondary | ICD-10-CM

## 2017-03-08 ENCOUNTER — Other Ambulatory Visit: Payer: Self-pay | Admitting: Internal Medicine

## 2017-03-10 ENCOUNTER — Encounter: Payer: Self-pay | Admitting: Infectious Diseases

## 2017-03-10 ENCOUNTER — Ambulatory Visit: Payer: Medicare Other

## 2017-03-31 ENCOUNTER — Other Ambulatory Visit: Payer: Self-pay | Admitting: Internal Medicine

## 2017-04-05 ENCOUNTER — Ambulatory Visit: Payer: Medicare Other

## 2017-04-05 ENCOUNTER — Other Ambulatory Visit: Payer: Self-pay | Admitting: Internal Medicine

## 2017-04-05 DIAGNOSIS — G629 Polyneuropathy, unspecified: Secondary | ICD-10-CM

## 2017-04-11 ENCOUNTER — Ambulatory Visit: Payer: Medicare Other | Admitting: Internal Medicine

## 2017-05-08 ENCOUNTER — Other Ambulatory Visit: Payer: Self-pay | Admitting: Internal Medicine

## 2017-05-08 DIAGNOSIS — G629 Polyneuropathy, unspecified: Secondary | ICD-10-CM

## 2017-05-10 ENCOUNTER — Other Ambulatory Visit: Payer: Self-pay | Admitting: Internal Medicine

## 2017-05-10 DIAGNOSIS — B20 Human immunodeficiency virus [HIV] disease: Secondary | ICD-10-CM

## 2017-06-05 ENCOUNTER — Encounter: Payer: Self-pay | Admitting: Internal Medicine

## 2017-06-05 ENCOUNTER — Ambulatory Visit: Payer: Medicare Other | Admitting: Internal Medicine

## 2017-06-05 ENCOUNTER — Encounter: Payer: Self-pay | Admitting: Physician Assistant

## 2017-06-08 ENCOUNTER — Other Ambulatory Visit: Payer: Self-pay | Admitting: Internal Medicine

## 2017-06-08 DIAGNOSIS — R0602 Shortness of breath: Secondary | ICD-10-CM

## 2017-06-26 ENCOUNTER — Ambulatory Visit: Payer: Medicare Other

## 2017-07-07 ENCOUNTER — Other Ambulatory Visit: Payer: Self-pay | Admitting: Internal Medicine

## 2017-07-17 ENCOUNTER — Ambulatory Visit: Payer: Medicare Other

## 2017-07-18 ENCOUNTER — Ambulatory Visit: Payer: Medicare Other

## 2017-07-31 ENCOUNTER — Other Ambulatory Visit: Payer: Self-pay | Admitting: Internal Medicine

## 2017-08-04 ENCOUNTER — Other Ambulatory Visit: Payer: Self-pay | Admitting: Internal Medicine

## 2017-08-04 DIAGNOSIS — J410 Simple chronic bronchitis: Secondary | ICD-10-CM

## 2017-09-29 ENCOUNTER — Other Ambulatory Visit: Payer: Self-pay | Admitting: Internal Medicine

## 2017-09-29 DIAGNOSIS — G629 Polyneuropathy, unspecified: Secondary | ICD-10-CM

## 2017-10-31 ENCOUNTER — Other Ambulatory Visit: Payer: Self-pay | Admitting: Internal Medicine

## 2017-10-31 DIAGNOSIS — B20 Human immunodeficiency virus [HIV] disease: Secondary | ICD-10-CM

## 2017-10-31 DIAGNOSIS — G629 Polyneuropathy, unspecified: Secondary | ICD-10-CM

## 2017-11-16 ENCOUNTER — Other Ambulatory Visit: Payer: Medicare Other

## 2017-11-30 ENCOUNTER — Encounter: Payer: Medicare Other | Admitting: Internal Medicine

## 2017-12-28 ENCOUNTER — Other Ambulatory Visit: Payer: Self-pay | Admitting: Internal Medicine

## 2018-01-24 ENCOUNTER — Other Ambulatory Visit: Payer: Self-pay | Admitting: Internal Medicine

## 2018-04-19 ENCOUNTER — Other Ambulatory Visit: Payer: Self-pay | Admitting: Internal Medicine

## 2018-04-19 DIAGNOSIS — B20 Human immunodeficiency virus [HIV] disease: Secondary | ICD-10-CM

## 2018-04-19 DIAGNOSIS — G629 Polyneuropathy, unspecified: Secondary | ICD-10-CM

## 2018-04-20 ENCOUNTER — Telehealth: Payer: Self-pay

## 2018-04-20 NOTE — Telephone Encounter (Signed)
Called [facility] for an alternative number to reach patient. Pharmacist was able to give me an alternative number [phone]. Was unable to reach patient with alternative number. Lorenso Courier, [place]

## 2018-04-20 NOTE — Telephone Encounter (Signed)
Received refill request for patients Genvoya, Hydrodiuril, Gabapentin. Patient has not been seen in office since 7/18. Unable to reach patient with either number listed in chart. Patient will have to call office to schedule an appointment for refills. Lorenso Courier, [place]

## 2018-06-27 ENCOUNTER — Telehealth: Payer: Self-pay

## 2018-06-27 NOTE — Telephone Encounter (Signed)
Patient is on overdue pap smear list. Need to ask patient if she has had a pap smear done recently. If not patient can schedule pap smear with Rexene AlbertsStephanie Dixon, [place]p. Called patient with alternative number listed in previous message. Patient was able to take my call states she has not had a pap smear done in a few years. States she would like to schedule pap smear when she comes to office in December to see DR. Colon Branchampbell Takela Varden L Kelwin Gibler, [place]

## 2018-07-24 ENCOUNTER — Encounter: Payer: Self-pay | Admitting: Internal Medicine

## 2018-07-24 ENCOUNTER — Ambulatory Visit (INDEPENDENT_AMBULATORY_CARE_PROVIDER_SITE_OTHER): Payer: Medicare Other | Admitting: Internal Medicine

## 2018-07-24 DIAGNOSIS — Z79899 Other long term (current) drug therapy: Secondary | ICD-10-CM

## 2018-07-24 DIAGNOSIS — B20 Human immunodeficiency virus [HIV] disease: Secondary | ICD-10-CM

## 2018-07-24 DIAGNOSIS — Z6841 Body Mass Index (BMI) 40.0 and over, adult: Secondary | ICD-10-CM

## 2018-07-24 DIAGNOSIS — M549 Dorsalgia, unspecified: Secondary | ICD-10-CM

## 2018-07-24 DIAGNOSIS — R0602 Shortness of breath: Secondary | ICD-10-CM

## 2018-07-24 DIAGNOSIS — I10 Essential (primary) hypertension: Secondary | ICD-10-CM | POA: Diagnosis not present

## 2018-07-24 DIAGNOSIS — Z91048 Other nonmedicinal substance allergy status: Secondary | ICD-10-CM

## 2018-07-24 DIAGNOSIS — Z882 Allergy status to sulfonamides status: Secondary | ICD-10-CM | POA: Diagnosis not present

## 2018-07-24 DIAGNOSIS — J410 Simple chronic bronchitis: Secondary | ICD-10-CM | POA: Diagnosis not present

## 2018-07-24 DIAGNOSIS — Z23 Encounter for immunization: Secondary | ICD-10-CM | POA: Diagnosis not present

## 2018-07-24 DIAGNOSIS — G629 Polyneuropathy, unspecified: Secondary | ICD-10-CM

## 2018-07-24 DIAGNOSIS — Z880 Allergy status to penicillin: Secondary | ICD-10-CM

## 2018-07-24 DIAGNOSIS — Z598 Other problems related to housing and economic circumstances: Secondary | ICD-10-CM

## 2018-07-24 MED ORDER — ALBUTEROL SULFATE HFA 108 (90 BASE) MCG/ACT IN AERS: 1.0000 | INHALATION_SPRAY | RESPIRATORY_TRACT | 5 refills | Status: AC | PRN

## 2018-07-24 MED ORDER — HYDROCHLOROTHIAZIDE 25 MG PO TABS: 25.0000 mg | ORAL_TABLET | Freq: Every day | ORAL | 5 refills | Status: AC

## 2018-07-24 MED ORDER — GABAPENTIN 300 MG PO CAPS: 600.0000 mg | ORAL_CAPSULE | Freq: Two times a day (BID) | ORAL | 5 refills | Status: AC

## 2018-07-24 MED ORDER — PROPRANOLOL HCL 10 MG PO TABS: 10.0000 mg | ORAL_TABLET | Freq: Two times a day (BID) | ORAL | 5 refills | Status: AC

## 2018-07-24 MED ORDER — ELVITEG-COBIC-EMTRICIT-TENOFAF 150-150-200-10 MG PO TABS: 1.0000 | ORAL_TABLET | Freq: Every day | ORAL | 5 refills | Status: DC

## 2018-07-24 MED FILL — GENVOYA TABLET: 150-150-200 | 30 days supply | Qty: 30 | Fill #0

## 2018-07-24 MED FILL — HYDROCHLOROTHIAZIDE 25 MG T: 25 | 30 days supply | Qty: 30 | Fill #0

## 2018-07-24 MED FILL — VENTOLIN HFA 90 MCG INHALER: 108 (90 BAS | 20 days supply | Qty: 18 | Fill #0

## 2018-07-24 MED FILL — GABAPENTIN 300 MG CAPSULE: 300 | 30 days supply | Qty: 120 | Fill #0

## 2018-07-24 NOTE — Progress Notes (Signed)
Patient Active Problem List   Diagnosis Date Noted  . Essential hypertension 10/18/2007    Priority: High  . Human immunodeficiency virus (HIV) disease (HCC) 10/13/2006    Priority: High  . OBESITY 10/13/2006    Priority: High  . Chronic bronchitis 08/04/2011    Priority: Medium  . UPPER RESPIRATORY INFECTION, ACUTE 08/24/2010  . BREAST PAIN, BILATERAL 04/12/2010  . FOOT PAIN, RIGHT 02/02/2010  . DYSPNEA ON EXERTION 09/03/2009  . CHEST PAIN, INTERMITTENT 09/03/2009  . ASCUS PAP 12/12/2008  . 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 CARPAL TUNNEL SYNDROME, BILATERAL 10/13/2006  . DENTAL CARIES 10/13/2006  . GERD 10/13/2006  . BOILS, RECURRENT 10/13/2006  . ALCOHOL ABUSE, HX OF 10/13/2006  . TOBACCO USE, QUIT 10/13/2006      Subjective: Jenna Watts is in for her first visit since July of last year.  At that time she had run out of her Genvoya and her infection had reactivated.  She was due to restarted and follow-up in 6 weeks.  She tells me that she has not been able to get back because of difficulties with transportation.  She says that she ran out of all of her medications in September.  She is not sure why.  She has not seen her primary care physician in quite some time.  Review of Systems: Review of Systems  Constitutional: Negative for chills, diaphoresis, fever, malaise/fatigue and weight loss.  HENT: Negative for sore throat.   Respiratory: Negative for cough, sputum production and shortness of breath.   Cardiovascular: Negative for chest pain.  Gastrointestinal: Negative for abdominal pain, diarrhea, heartburn, nausea and vomiting.  Genitourinary: Negative for dysuria and frequency.  Musculoskeletal: Positive for back pain. Negative for joint pain and myalgias.  Skin: Negative for rash.  Neurological: Negative for dizziness and headaches.  Psychiatric/Behavioral: Negative for depression and substance abuse. The patient is not nervous/anxious.     No past  medical history on file.  Social History   Tobacco Use  . Smoking status: Never Smoker  . Smokeless tobacco: Never Used  Substance Use Topics  . Alcohol use: No  . Drug use: No    No family history on file.  Allergies  Allergen Reactions  . Blue Dyes (Parenteral)   . Penicillins   . Sulfonamide Derivatives     Health Maintenance  Topic Date Due  . TETANUS/TDAP  10/05/1981  . MAMMOGRAM  10/05/2012  . COLONOSCOPY  10/05/2012  . PAP SMEAR  03/14/2017  . INFLUENZA VACCINE  03/15/2018  . Hepatitis C Screening  Completed  . HIV Screening  Completed    Objective:  Vitals:   07/24/18 1046  BP: (!) 168/114  Pulse: 79  Temp: 98.1 F (36.7 C)  Weight: 275 lb (124.7 kg)   Body mass index is 50.3 kg/m.  Physical Exam  Constitutional: She is oriented to person, place, and time.  Her weight is up 17 more pounds.  She is morbidly obese.  HENT:  Mouth/Throat: No oropharyngeal exudate.  Eyes: Conjunctivae are normal.  Cardiovascular: Normal rate, regular rhythm and normal heart sounds.  No murmur heard. Very distant heart sounds  Pulmonary/Chest: Breath sounds normal.  Abdominal: Soft. She exhibits no mass. There is no tenderness.  Musculoskeletal: Normal range of motion.  Neurological: She is alert and oriented to person, place, and time.  Skin: No rash noted.  Psychiatric: She has a normal mood and affect.    Lab Results Lab Results  Component  Value Date   WBC 10.0 02/28/2017   HGB 14.5 02/28/2017   HCT 44.8 02/28/2017   MCV 87.3 02/28/2017   PLT 218 02/28/2017    Lab Results  Component Value Date   CREATININE 0.54 02/28/2017   BUN 8 02/28/2017   NA 134 (L) 02/28/2017   K 3.8 02/28/2017   CL 98 02/28/2017   CO2 26 02/28/2017    Lab Results  Component Value Date   ALT 49 (H) 02/28/2017   AST 89 (H) 02/28/2017   ALKPHOS 74 02/28/2017   BILITOT 0.5 02/28/2017    Lab Results  Component Value Date   CHOL 139 12/04/2014   HDL 61 12/04/2014    LDLCALC 53 12/04/2014   TRIG 125 12/04/2014   CHOLHDL 2.3 12/04/2014   Lab Results  Component Value Date   LABRPR NON REAC 02/28/2017   HIV 1 RNA Quant (copies/mL)  Date Value  02/28/2017 31,700 (H)  04/19/2016 <20  06/09/2015 <20   CD4 T Cell Abs (/uL)  Date Value  02/28/2017 430  04/19/2016 480  06/09/2015 540     Problem List Items Addressed This Visit      High   OBESITY    She has severe, morbid obesity and little insight into her financial difficulties with this.  She has not seen her primary care physician in quite a long time.  I encouraged her to call and arrange follow-up as soon as possible.      Human immunodeficiency virus (HIV) disease (HCC)    Jenna Watts has difficulty with transportation has made it difficult for her to keep her visits here and as a result she has been on and off her medications.  I will check lab work today and restart Genvoya.  She will follow-up in 6 weeks.  She received her influenza vaccine today.      Relevant Medications   elvitegravir-cobicistat-emtricitabine-tenofovir (GENVOYA) 150-150-200-10 MG TABS tablet   Other Relevant Orders   T-helper cell (CD4)- ([facility] clinic only)   HIV-1 RNA quant-no reflex-bld   CBC   Comprehensive metabolic panel   RPR   T-helper cell (CD4)- ([facility] clinic only)   HIV-1 RNA quant-no reflex-bld   Essential hypertension    Her blood pressure is well above goal today.  Will refill her antihypertensive medication and see her back in 6 weeks.  I have encouraged her to follow-up with her PCP as soon as possible.      Relevant Medications   hydrochlorothiazide (HYDRODIURIL) 25 MG tablet   propranolol (INDERAL) 10 MG tablet    Other Visit Diagnoses    HIV disease (HCC)       Relevant Medications   elvitegravir-cobicistat-emtricitabine-tenofovir (GENVOYA) 150-150-200-10 MG TABS tablet   Neuropathy       Relevant Medications   gabapentin (NEURONTIN) 300 MG capsule   Simple chronic bronchitis (HCC)              Cliffton Asters, MD [facility] for Infect[facility] Disease [facility] [phone] pager   [phone] cell 07/24/2018, 11:16 AM

## 2018-07-24 NOTE — Assessment & Plan Note (Signed)
She has severe, morbid obesity and little insight into her financial difficulties with this.  She has not seen her primary care physician in quite a long time.  I encouraged her to call and arrange follow-up as soon as possible.

## 2018-07-24 NOTE — Assessment & Plan Note (Signed)
Her blood pressure is well above goal today.  Will refill her antihypertensive medication and see her back in 6 weeks.  I have encouraged her to follow-up with her PCP as soon as possible.

## 2018-07-24 NOTE — Assessment & Plan Note (Signed)
Jenna Hashimotoatricia has difficulty with transportation has made it difficult for her to keep her visits here and as a result she has been on and off her medications.  I will check lab work today and restart Genvoya.  She will follow-up in 6 weeks.  She received her influenza vaccine today.

## 2018-07-24 NOTE — Progress Notes (Signed)
HPI: Jenna Watts is a 57 y.o. female who presents to the [facility] clinic today to see Dr. Orvan Falconerampbell for her HIV infection.  Patient Active Problem List   Diagnosis Date Noted  . Chronic bronchitis 08/04/2011  . UPPER RESPIRATORY INFECTION, ACUTE 08/24/2010  . BREAST PAIN, BILATERAL 04/12/2010  . FOOT PAIN, RIGHT 02/02/2010  . DYSPNEA ON EXERTION 09/03/2009  . CHEST PAIN, INTERMITTENT 09/03/2009  . ASCUS PAP 12/12/2008  . Essential hypertension 10/18/2007  . Human immunodeficiency virus (HIV) disease (HCC) 10/13/2006  . OBESITY 10/13/2006  . 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 CARPAL TUNNEL SYNDROME, BILATERAL 10/13/2006  . DENTAL CARIES 10/13/2006  . GERD 10/13/2006  . BOILS, RECURRENT 10/13/2006  . ALCOHOL ABUSE, HX OF 10/13/2006  . TOBACCO USE, QUIT 10/13/2006    Allergies: Allergies  Allergen Reactions  . Blue Dyes (Parenteral)   . Penicillins   . Sulfonamide Derivatives     Past Medical History: No past medical history on file.  Social History: Social History   Socioeconomic History  . Marital status: Widowed    Spouse name: Not on file  . Number of children: Not on file  . Years of education: Not on file  . Highest education level: Not on file  Occupational History  . Not on file  Social Needs  . Financial resource strain: Not on file  . Food insecurity:    Worry: Not on file    Inability: Not on file  . Transportation needs:    Medical: Not on file    Non-medical: Not on file  Tobacco Use  . Smoking status: Never Smoker  . Smokeless tobacco: Never Used  Substance and Sexual Activity  . Alcohol use: No  . Drug use: No  . Sexual activity: Not Currently    Partners: Male    Comment: given condoms  Lifestyle  . Physical activity:    Days per week: Not on file    Minutes per session: Not on file  . Stress: Not on file  Relationships  . Social connections:    Talks on phone: Not on file    Gets together: Not on file    Attends religious service:  Not on file    Active member of club or organization: Not on file    Attends meetings of clubs or organizations: Not on file    Relationship status: Not on file  Other Topics Concern  . Not on file  Social History Narrative  . Not on file    Labs: Lab Results  Component Value Date   HIV1RNAQUANT 31,700 (H) 02/28/2017   HIV1RNAQUANT <20 04/19/2016   HIV1RNAQUANT <20 06/09/2015   CD4TABS 430 02/28/2017   CD4TABS 480 04/19/2016   CD4TABS 540 06/09/2015    RPR and STI Lab Results  Component Value Date   LABRPR NON REAC 02/28/2017   LABRPR NON REAC 04/19/2016   LABRPR NON REAC 12/04/2014   LABRPR NON REAC 11/04/2013   LABRPR NON REAC 09/25/2012    No flowsheet data found.  Hepatitis B Lab Results  Component Value Date   HEPBSAB No 10/09/2006   HEPBSAG No 10/09/2006   Hepatitis C No results found for: HEPCAB, HCVRNAPCRQN Hepatitis A No results found for: HAV Lipids: Lab Results  Component Value Date   CHOL 139 12/04/2014   TRIG 125 12/04/2014   HDL 61 12/04/2014   CHOLHDL 2.3 12/04/2014   VLDL 25 12/04/2014   LDLCALC 53 12/04/2014    Current HIV Regimen: Genvoya  Assessment:  Jenna Watts is here today to see Dr. Orvan Falconer for her HIV infection. She has been out of care for awhile and was last seen on 02/28/17. She comes in and out of care and has been on and off of her Genvoya for years. She tells me today that she hasn't taking it in a long time. When asked why, she states that it stopped showing up at her house.  She doesn't have very many explanations for why she is on and off medications.  We were filling it at [facility] but had to transfer it out back in August of 2018 due to her insurance.  We are able to fill it now and will mail it to her house.  I told her that she must answer the phone to receive refills and she promised she would.  I will have her follow up with me in a few weeks to check labs and adherence.  [facility] will also mail her gabapentin, HCTZ, and  albuterol to her.  Plan: - Restart Genvoya PO once daily - Labs today - F/u with me 1/8 at 1045am  Cassie L. Kuppelweiser, PharmD, BCIDP, AAHIVP, CPP Infectious Diseases Clinical Pharmacist [facility] 07/24/2018, 11:37 AM

## 2018-07-26 LAB — COMPREHENSIVE METABOLIC PANEL
AG RATIO: 1.3 (calc) (ref 1.0–2.5)
ALT: 22 U/L (ref 6–29)
AST: 21 U/L (ref 10–35)
Albumin: 4.2 g/dL (ref 3.6–5.1)
Alkaline phosphatase (APISO): 109 U/L (ref 33–130)
BUN: 12 mg/dL (ref 7–25)
CO2: 27 mmol/L (ref 20–32)
Calcium: 10 mg/dL (ref 8.6–10.4)
Chloride: 104 mmol/L (ref 98–110)
Creat: 0.63 mg/dL (ref 0.50–1.05)
Globulin: 3.3 g/dL (calc) (ref 1.9–3.7)
Glucose, Bld: 193 mg/dL — ABNORMAL HIGH (ref 65–99)
Potassium: 4.8 mmol/L (ref 3.5–5.3)
Sodium: 139 mmol/L (ref 135–146)
TOTAL PROTEIN: 7.5 g/dL (ref 6.1–8.1)
Total Bilirubin: 0.4 mg/dL (ref 0.2–1.2)

## 2018-07-26 LAB — CBC
HCT: 45.3 % — ABNORMAL HIGH (ref 35.0–45.0)
Hemoglobin: 15.3 g/dL (ref 11.7–15.5)
MCH: 29.8 pg (ref 27.0–33.0)
MCHC: 33.8 g/dL (ref 32.0–36.0)
MCV: 88.1 fL (ref 80.0–100.0)
MPV: 9.5 fL (ref 7.5–12.5)
PLATELETS: 264 10*3/uL (ref 140–400)
RBC: 5.14 10*6/uL — ABNORMAL HIGH (ref 3.80–5.10)
RDW: 12.6 % (ref 11.0–15.0)
WBC: 7.2 10*3/uL (ref 3.8–10.8)

## 2018-07-26 LAB — T-HELPER CELL (CD4) - (RCID CLINIC ONLY)
CD4 % Helper T Cell: 19 % — ABNORMAL LOW (ref 33–55)
CD4 T CELL ABS: 480 /uL (ref 400–2700)

## 2018-07-26 LAB — HIV-1 RNA QUANT-NO REFLEX-BLD
HIV 1 RNA Quant: 3670 copies/mL — ABNORMAL HIGH
HIV-1 RNA Quant, Log: 3.56 Log copies/mL — ABNORMAL HIGH

## 2018-07-26 LAB — RPR: RPR Ser Ql: NONREACTIVE

## 2018-08-22 ENCOUNTER — Ambulatory Visit: Payer: Medicare Other

## 2018-08-23 MED FILL — GABAPENTIN 300 MG CAPSULE: 300 | 30 days supply | Qty: 120 | Fill #1

## 2018-08-23 MED FILL — GENVOYA TABLET: 150-150-200 | 30 days supply | Qty: 30 | Fill #1

## 2018-08-23 MED FILL — VENTOLIN HFA 90 MCG INHALER: 108 (90 BAS | 20 days supply | Qty: 18 | Fill #1

## 2018-08-23 MED FILL — HYDROCHLOROTHIAZIDE 25 MG T: 25 | 30 days supply | Qty: 30 | Fill #1

## 2018-09-18 MED FILL — VENTOLIN HFA 90 MCG INHALER: 108 (90 BAS | 20 days supply | Qty: 18 | Fill #2

## 2018-09-18 MED FILL — HYDROCHLOROTHIAZIDE 25 MG T: 25 | 30 days supply | Qty: 30 | Fill #2

## 2018-09-18 MED FILL — GABAPENTIN 300 MG CAPSULE: 300 | 30 days supply | Qty: 120 | Fill #2

## 2018-09-18 MED FILL — GENVOYA TABLET: 150-150-200 | 30 days supply | Qty: 30 | Fill #2

## 2018-10-12 MED FILL — VENTOLIN HFA 90 MCG INHALER: 108 (90 BAS | 20 days supply | Qty: 18 | Fill #3

## 2018-10-12 MED FILL — GENVOYA TABLET: 150-150-200 | 30 days supply | Qty: 30 | Fill #3

## 2018-10-12 MED FILL — GABAPENTIN 300 MG CAPSULE: 300 | 30 days supply | Qty: 120 | Fill #3

## 2018-10-12 MED FILL — HYDROCHLOROTHIAZIDE 25 MG T: 25 | 30 days supply | Qty: 30 | Fill #3

## 2018-10-23 ENCOUNTER — Ambulatory Visit: Payer: Medicare Other | Admitting: Internal Medicine

## 2018-11-06 MED FILL — VENTOLIN HFA 90 MCG INHALER: 108 (90 BAS | 20 days supply | Qty: 18 | Fill #4

## 2018-11-06 MED FILL — GABAPENTIN 300 MG CAPSULE: 300 | 30 days supply | Qty: 120 | Fill #4

## 2018-11-06 MED FILL — HYDROCHLOROTHIAZIDE 25 MG T: 25 | 30 days supply | Qty: 30 | Fill #4

## 2018-11-06 MED FILL — GENVOYA TABLET: 150-150-200 | 30 days supply | Qty: 30 | Fill #4

## 2018-11-29 ENCOUNTER — Telehealth: Payer: Self-pay

## 2018-11-29 NOTE — Telephone Encounter (Signed)
Called [facility] in attempt to schedule follow up appointment for continuity of care, missed appointments in the past.  Phone continued to ring no option to leave a message.   Gerarda Fraction, [place]

## 2018-12-06 MED FILL — GENVOYA TABLET: 150-150-200 | 30 days supply | Qty: 30 | Fill #5

## 2018-12-06 MED FILL — VENTOLIN HFA 90 MCG INHALER: 108 (90 BAS | 20 days supply | Qty: 18 | Fill #5

## 2018-12-27 ENCOUNTER — Other Ambulatory Visit: Payer: Self-pay | Admitting: Internal Medicine

## 2018-12-27 DIAGNOSIS — B20 Human immunodeficiency virus [HIV] disease: Secondary | ICD-10-CM

## 2018-12-27 NOTE — Telephone Encounter (Signed)
Attempted to call patient due to overdue labs and follow up appointment. Unable to leave  Voicemail due to not been set up. Valarie Cones, LPN

## 2018-12-29 MED FILL — GENVOYA TABLET: 150-150-200 | 30 days supply | Qty: 30 | Fill #0

## 2019-01-21 ENCOUNTER — Other Ambulatory Visit: Payer: Self-pay | Admitting: Internal Medicine

## 2019-01-21 DIAGNOSIS — B20 Human immunodeficiency virus [HIV] disease: Secondary | ICD-10-CM

## 2019-01-21 DIAGNOSIS — R0602 Shortness of breath: Secondary | ICD-10-CM

## 2019-01-22 ENCOUNTER — Other Ambulatory Visit: Payer: Self-pay | Admitting: Internal Medicine

## 2019-01-22 DIAGNOSIS — B20 Human immunodeficiency virus [HIV] disease: Secondary | ICD-10-CM

## 2019-01-30 ENCOUNTER — Telehealth: Payer: Self-pay | Admitting: Pharmacy Technician

## 2019-01-30 NOTE — Telephone Encounter (Signed)
[facility] Patient Advocate Encounter  Spoke with the patient today to see if we could get her scheduled for an appointment in order to get her Genvoya refilled. She refused to be transferred to scheduler and preferred to call in herself sometime next week. She stated that she would not be able to come in until July. Will follow-up and see if appointment is made next week. Patient currently has 7 tablets remaining.

## 2019-07-29 ENCOUNTER — Telehealth: Payer: Self-pay | Admitting: *Deleted

## 2019-07-29 NOTE — Telephone Encounter (Signed)
Received call from Clair Gulling at [facility] [phone], patient was found down at home and transported to [facility].  Per Clair Gulling, patient has been out of care, has stable CD4 (mid 300's) but is viremic at 85,000+. Unfortunately, [facility] is unable to connect on this patient to [facility].  Lavone Neri, Lanice Schwab, RN

## 2019-07-29 NOTE — Telephone Encounter (Signed)
Thanks for letting me know!

## 2019-08-20 ENCOUNTER — Encounter: Payer: Self-pay | Admitting: Internal Medicine

## 2019-08-20 NOTE — Progress Notes (Signed)
Patient ID: Jenna Watts, female   DOB: 1961-04-30, 59 y.o.   MRN: [id number] Working Viral Load List  Called patient to schedule  Voice mail not set up

## 2021-07-01 ENCOUNTER — Telehealth: Payer: Self-pay

## 2021-07-01 NOTE — Telephone Encounter (Signed)
Patient last seen 07/2018, called to offer appointment and re-engage in care. Calls could not be completed.   Sandie Ano, RN

## 2022-09-28 ENCOUNTER — Telehealth: Payer: Self-pay

## 2022-09-28 NOTE — Telephone Encounter (Signed)
Received call from Seth Bake with [facility]. Patient is in their office now and will be transferring her care there.   Seth Bake faxed over release of information, faxed last office note and most recent labs to [facility].   Beryle Flock, RN

## 2024-01-14 DEATH — deceased
